# Patient Record
Sex: Male | Born: 1940 | Race: White | Hispanic: No | Marital: Married | Smoking: Former smoker
Health system: Southern US, Community
[De-identification: ages and names within clinical notes are randomized; demographics above are authoritative.]

## PROBLEM LIST (undated history)

## (undated) DIAGNOSIS — M1990 Unspecified osteoarthritis, unspecified site: Secondary | ICD-10-CM

## (undated) DIAGNOSIS — I255 Ischemic cardiomyopathy: Secondary | ICD-10-CM

## (undated) DIAGNOSIS — I2510 Atherosclerotic heart disease of native coronary artery without angina pectoris: Secondary | ICD-10-CM

## (undated) DIAGNOSIS — I10 Essential (primary) hypertension: Secondary | ICD-10-CM

## (undated) DIAGNOSIS — E785 Hyperlipidemia, unspecified: Secondary | ICD-10-CM

## (undated) DIAGNOSIS — E119 Type 2 diabetes mellitus without complications: Secondary | ICD-10-CM

## (undated) DIAGNOSIS — K219 Gastro-esophageal reflux disease without esophagitis: Secondary | ICD-10-CM

---

## 2001-03-18 HISTORY — PX: CORONARY ANGIOPLASTY: SHX604

## 2001-09-03 ENCOUNTER — Ambulatory Visit (HOSPITAL_COMMUNITY): Admission: RE | Admit: 2001-09-03 | Discharge: 2001-09-04 | Payer: Self-pay | Admitting: Cardiology

## 2006-03-18 HISTORY — PX: CARDIAC CATHETERIZATION: SHX172

## 2006-07-15 ENCOUNTER — Ambulatory Visit: Payer: Self-pay | Admitting: Nurse Practitioner

## 2006-08-04 ENCOUNTER — Ambulatory Visit: Payer: Self-pay | Admitting: Physician Assistant

## 2006-08-12 ENCOUNTER — Inpatient Hospital Stay (HOSPITAL_BASED_OUTPATIENT_CLINIC_OR_DEPARTMENT_OTHER): Admission: RE | Admit: 2006-08-12 | Discharge: 2006-08-12 | Payer: Self-pay | Admitting: Cardiology

## 2006-08-12 ENCOUNTER — Ambulatory Visit: Payer: Self-pay | Admitting: Cardiology

## 2006-08-18 ENCOUNTER — Ambulatory Visit: Payer: Self-pay | Admitting: Cardiology

## 2006-08-22 ENCOUNTER — Ambulatory Visit: Payer: Self-pay | Admitting: Physician Assistant

## 2008-04-12 ENCOUNTER — Encounter: Admission: RE | Admit: 2008-04-12 | Discharge: 2008-04-12 | Payer: Self-pay | Admitting: Orthopaedic Surgery

## 2008-04-18 HISTORY — PX: CERVICAL SPINE SURGERY: SHX589

## 2008-05-11 ENCOUNTER — Inpatient Hospital Stay (HOSPITAL_COMMUNITY): Admission: RE | Admit: 2008-05-11 | Discharge: 2008-05-12 | Payer: Self-pay | Admitting: Orthopedic Surgery

## 2008-05-11 ENCOUNTER — Encounter (INDEPENDENT_AMBULATORY_CARE_PROVIDER_SITE_OTHER): Payer: Self-pay | Admitting: Orthopedic Surgery

## 2009-09-15 HISTORY — PX: CATARACT EXTRACTION: SUR2

## 2009-10-12 ENCOUNTER — Ambulatory Visit (HOSPITAL_COMMUNITY): Admission: RE | Admit: 2009-10-12 | Discharge: 2009-10-12 | Payer: Self-pay | Admitting: Ophthalmology

## 2010-02-15 ENCOUNTER — Encounter: Payer: Self-pay | Admitting: Cardiology

## 2010-02-19 ENCOUNTER — Encounter: Payer: Self-pay | Admitting: Cardiology

## 2010-02-21 ENCOUNTER — Encounter: Payer: Self-pay | Admitting: Cardiology

## 2010-02-21 DIAGNOSIS — E1151 Type 2 diabetes mellitus with diabetic peripheral angiopathy without gangrene: Secondary | ICD-10-CM | POA: Insufficient documentation

## 2010-02-21 DIAGNOSIS — E785 Hyperlipidemia, unspecified: Secondary | ICD-10-CM | POA: Insufficient documentation

## 2010-02-21 DIAGNOSIS — K219 Gastro-esophageal reflux disease without esophagitis: Secondary | ICD-10-CM | POA: Insufficient documentation

## 2010-02-21 DIAGNOSIS — I2510 Atherosclerotic heart disease of native coronary artery without angina pectoris: Secondary | ICD-10-CM | POA: Insufficient documentation

## 2010-02-21 DIAGNOSIS — I10 Essential (primary) hypertension: Secondary | ICD-10-CM | POA: Insufficient documentation

## 2010-02-23 ENCOUNTER — Encounter (INDEPENDENT_AMBULATORY_CARE_PROVIDER_SITE_OTHER): Payer: Self-pay | Admitting: *Deleted

## 2010-02-23 ENCOUNTER — Ambulatory Visit: Payer: Self-pay | Admitting: Cardiology

## 2010-04-17 NOTE — Letter (Signed)
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 [address]   [place], [place] [address]   Phone: [phone]  Fax: [phone]    02/23/2010  TO: Leodis Sias IT MAY CONCERN   RE: VIRGLE ARTH Harju [address] [id number]   The above named individual is under my medical care and may return to work on: 02/23/10 without restrictions.  If you have any further questions or need additional information, please call.     Sincerely,    Gene Serpe, PA-C/Jennifer Christell Constant, RN, BSN

## 2010-04-17 NOTE — Miscellaneous (Signed)
  Clinical Lists Changes  Problems: Added new problem of CAD (ICD-414.00) Added new problem of DYSLIPIDEMIA (ICD-272.4) Added new problem of DM (ICD-250.00) Added new problem of GERD (ICD-530.81) Added new problem of HYPERTENSION (ICD-401.9) Observations: Added new observation of PAST MED HX: CAD    stent to right coronary artery..2003  /    catheterization.. ZOX,0960... total RCA at stent site with left to right collaterals, 70% proximal circumflex, 50% mid LAD... intensive medical therapy recommended  /  nuclear February 15, 2010.. walked 3 minutes Bruce protocol, no EKG change, no chest pain, hypertensive response to stress, diaphragmatic attenuation, EF 49%, study is compatible with inferior ischemia EF 55%.... catheterization... 2008 Dyslipidemia Diabetes mellitus GERD Hypertension Osteoarthritis Erectile dysfunction (02/21/2010 17:18)       Past History:  Past Medical History: CAD    stent to right coronary artery..2003  /    catheterization.. AVW,0981... total RCA at stent site with left to right collaterals, 70% proximal circumflex, 50% mid LAD... intensive medical therapy recommended  /  nuclear February 15, 2010.. walked 3 minutes Bruce protocol, no EKG change, no chest pain, hypertensive response to stress, diaphragmatic attenuation, EF 49%, study is compatible with inferior ischemia EF 55%.... catheterization... 2008 Dyslipidemia Diabetes mellitus GERD Hypertension Osteoarthritis Erectile dysfunction

## 2010-04-17 NOTE — Letter (Signed)
Summary: Letter/ [facility]  Letter/ [facility]   Imported By: Dorise Hiss 02/22/2010 16:42:23  _____________________________________________________________________  External Attachment:    Type:   Image     Comment:   External Document

## 2010-04-19 NOTE — Assessment & Plan Note (Signed)
Summary: np-abnormal stress test   Visit Type:  Initial Consult Primary Amil Bouwman:  Guido Sander   History of Present Illness: patient presents for evaluation of a recent abnormal stress Cardiolite test, in the context of known CAD. He has not been seen in our clinic since June 2008.  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 He was seen in the occupational health clinic, and then referred to Dr. Reuel Boom, who ordered an exercise stress Cardiolite. He had not had an ischemic evaluation, since undergoing cardiac catheterization, by Dr. Charlies Constable, in 2008. This revealed 100% occlusion of the distal RCA at the stent site, with distal collateralization, and otherwise noncritical disease; preserved LVF.  Clinically, patient has not had any recurrent anterior chest discomfort, or significant DOE, since undergoing initial stenting of the RCA, 2003. Prior to that intervention, he did experience exertional chest tightness.  The recent stress test was completed on December 1, and reviewed by Dr. Diona Browner. Patient exercised a little over 3 minutes, attaining 88% PMHR. There was no reported chest pain, or significant EKG changes. Perfusion imaging was consistent with inferior ischemia; EF 49%.  Preventive Screening-Counseling & Management  Alcohol-Tobacco     Smoking Status: quit     Year Quit: 1985  Current Medications (verified): 1)  Metformin Hcl 1000 Mg Tabs (Metformin Hcl) .... Take 1 Tablet By Mouth Two Times A Day 2)  Fish Oil 1000 Mg Caps (Omega-3 Fatty Acids) .... Take 1 Tablet By Mouth Once A Day 3)  Aspir-Low 81 Mg Tbec (Aspirin) .... Take 1 Tablet By Mouth Once A Day 4)  Plavix 75 Mg Tabs (Clopidogrel Bisulfate) .... Take 1 Tablet By Mouth Once A Day 5)  Lantus 100 Unit/ml Soln (Insulin Glargine) .... Use As Directed 6)  Glucosamine 500 Mg Caps (Glucosamine Sulfate) .... Take 1 Tablet By Mouth  Once A Day 7)  Hydrochlorothiazide 25 Mg Tabs (Hydrochlorothiazide) .... Take 1/2 Tablet By Mouth Once A Day 8)  Crestor 10 Mg Tabs (Rosuvastatin Calcium) .... Take 1 Tablet By Mouth Once A Day 9)  Lisinopril 40 Mg Tabs (Lisinopril) .... Take 1 Tablet By Mouth Once A Day  Allergies (verified): No Known Drug Allergies  Comments:  Nurse/Medical Assistant: The patient's medication bottles and allergies were reviewed with the patient and were updated in the Medication and Allergy Lists.  Past History:  Past Medical History: Last updated: 02/21/2010 CAD    stent to right coronary artery..2003  /    catheterization.. LKG,4010... total RCA at stent site with left to right collaterals, 70% proximal circumflex, 50% mid LAD... intensive medical therapy recommended  /  nuclear February 15, 2010.. walked 3 minutes Bruce protocol, no EKG change, no chest pain, hypertensive response to stress, diaphragmatic attenuation, EF 49%, study is compatible with inferior ischemia EF 55%.... catheterization... 2008 Dyslipidemia Diabetes mellitus GERD Hypertension Osteoarthritis Erectile dysfunction  Social History: Smoking Status:  quit  Review of Systems       No fevers, chills, hemoptysis, dysphagia, melena, hematocheezia, hematuria, rash, claudication, orthopnea, pnd, pedal edema. Complaint right knee pain, but no claudication. All other systems negative.   Vital Signs:  Patient profile:   70 year old male Height:      71 inches Weight:      255 pounds BMI:     35.69 Pulse rate:   80 / minute BP sitting:   136 / 80  (left arm) Cuff size:  large  Vitals Entered By: Carlye Grippe (February 23, 2010 1:53 PM)  Nutrition Counseling: Patient's BMI is greater than 25 and therefore counseled on weight management options.   Physical Exam  Additional Exam:  GEN: 14 a-year-old male, obese, in no distress HEENT: NCAT,PERRLA,EOMI NECK: palpable pulses, no bruits; no JVD; no TM LUNGS: CTA  bilaterally HEART: RRR (S1S2); no significant murmurs; no rubs; no gallops ABD: soft, NT; intact BS EXT: intact distal pulses; probable bilateral femoral pulses normal without bruits; no edema SKIN: warm, dry MUSC: no obvious deformity NEURO: A/O (x3)     Impression & Recommendations:  Problem # 1:  CAD (ICD-414.00)  no further cardiac workup or testing is recommended. Results of the recent stress test were reviewed in full, in conjunction with Dr. Myrtis Ser. We feel that this perfusion defect is consistent with previously documented coronary anatomy, by catheterization in 2008, which indicated 100% occlusion of the distal RCA stent site, with collateral circulation. Moreover, the patient presents with no interim development of exertional chest discomfort or dyspnea. 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  Problem # 2:  DYSLIPIDEMIA (ICD-272.4)  aggressive lipid management recommended with target LDL 70 or less, if feasible. We'll defer to Dr. Reuel Boom for ongoing monitoring and management  Problem # 3:  HYPERTENSION (ICD-401.9) Assessment: Comment Only  Problem # 4:  DM (ICD-250.00) Assessment: Comment Only  Patient Instructions: 1)  Your physician wants you to follow-up in: 6 months. You will receive a reminder letter in the mail one-two months in advance. If you don't receive a letter, please call our office to schedule the follow-up appointment. 2)  Your physician recommends that you continue on your current medications as directed. Please refer to the Current Medication list given to you today.

## 2010-06-02 LAB — GLUCOSE, CAPILLARY: Glucose-Capillary: 184 mg/dL — ABNORMAL HIGH (ref 70–99)

## 2010-06-02 LAB — BASIC METABOLIC PANEL
BUN: 16 mg/dL (ref 6–23)
CO2: 22 mEq/L (ref 19–32)
Glucose, Bld: 200 mg/dL — ABNORMAL HIGH (ref 70–99)
Potassium: 4.3 mEq/L (ref 3.5–5.1)
Sodium: 135 mEq/L (ref 135–145)

## 2010-07-03 LAB — GLUCOSE, CAPILLARY
Glucose-Capillary: 156 mg/dL — ABNORMAL HIGH (ref 70–99)
Glucose-Capillary: 157 mg/dL — ABNORMAL HIGH (ref 70–99)

## 2010-07-03 LAB — COMPREHENSIVE METABOLIC PANEL
Alkaline Phosphatase: 64 U/L (ref 39–117)
BUN: 11 mg/dL (ref 6–23)
CO2: 28 mEq/L (ref 19–32)
Chloride: 100 mEq/L (ref 96–112)
GFR calc non Af Amer: >60 mL/min (ref >60)
Glucose, Bld: 171 mg/dL — ABNORMAL HIGH (ref 70–99)
Potassium: 5 mEq/L (ref 3.5–5.1)
Total Bilirubin: 0.3 mg/dL (ref 0.3–1.2)

## 2010-07-03 LAB — URINALYSIS, ROUTINE W REFLEX MICROSCOPIC
Bilirubin Urine: NEGATIVE
Hgb urine dipstick: NEGATIVE
Specific Gravity, Urine: 1.012 (ref 1.005–1.030)
Urobilinogen, UA: 1 mg/dL (ref 0.0–1.0)

## 2010-07-03 LAB — DIFFERENTIAL
Basophils Absolute: 0 10*3/uL (ref 0.0–0.1)
Basophils Relative: 1 % (ref 0–1)
Monocytes Absolute: 0.6 10*3/uL (ref 0.1–1.0)
Neutro Abs: 2.4 10*3/uL (ref 1.7–7.7)
Neutrophils Relative %: 48 % (ref 43–77)

## 2010-07-03 LAB — CBC
HCT: 41.2 % (ref 39.0–52.0)
Hemoglobin: 14.6 g/dL (ref 13.0–17.0)
MCHC: 35.4 g/dL (ref 30.0–36.0)
MCV: 87.6 fL (ref 78.0–100.0)
Platelets: 118 10*3/uL — ABNORMAL LOW (ref 150–400)
RDW: 12.9 % (ref 11.5–15.5)
WBC: 5.1 10*3/uL (ref 4.0–10.5)

## 2010-07-03 LAB — HEMOGLOBIN A1C: Hgb A1c MFr Bld: 7.3 % — ABNORMAL HIGH (ref 4.6–6.1)

## 2010-07-03 LAB — BASIC METABOLIC PANEL
BUN: 6 mg/dL (ref 6–23)
CO2: 28 mEq/L (ref 19–32)
Chloride: 103 mEq/L (ref 96–112)
Creatinine, Ser: 0.73 mg/dL (ref 0.4–1.5)
Glucose, Bld: 130 mg/dL — ABNORMAL HIGH (ref 70–99)

## 2010-07-03 LAB — TYPE AND SCREEN: Antibody Screen: NEGATIVE

## 2010-07-03 LAB — APTT: aPTT: 33 seconds (ref 24–37)

## 2010-07-03 LAB — PROTIME-INR: INR: 1 (ref 0.00–1.49)

## 2010-07-31 NOTE — Assessment & Plan Note (Signed)
[facility]                          [facility] NOTE   NAME:Trevor [name], Trevor [name]                     MRN:          [id number]  DATE:08/18/2006                            DOB:          February 12, 1941    CARDIOLOGIST:  Jonelle Sidle, M.D.   PRIMARY CARE PHYSICIAN:  Dr. Donzetta Sprung.   HISTORY OF PRESENT ILLNESS:  Mr. Flamenco is a 70 year old male patient  with a history of coronary disease who was seen back on Aug 04, 2006, in  followup.  He was set up for cardiac catheterization secondary to chest  discomfort concerning for a stable exertional angina.  This was  performed on Aug 12, 2006 by Dr. Juanda Chance.  This revealed a total  occlusion of the distal RCA at the stent site with left and right to  right collaterals.  He also had a 70% narrowing of the proximal  circumflex and 50% narrowing of the mid LAD.  His EF was 55%.  Dr.  Juanda Chance recommended intensive medical therapy.  The patient returns today  for followup.  He only notes one episode of chest tightness since we  last saw him when he was chasing his grandson.  He has not taken any  nitroglycerin.  He denies any rest symptoms.  He denies any syncope or  near syncope.  He denies any significant shortness of breath.  He does  note a history of intolerance to Statins in the past.  He has tried  Lipitor and Zocor.  These both caused myalgias.  Dr. Juanda Chance recommended  intensive secondary prevention secondary to increased plaque burden at  the time of the patient's cardiac catheterization.   CURRENT MEDICATIONS:  1. Metformin 500 mg b.i.d.  2. Niaspan 500 mg daily.  3. Fish oil daily.  4. Aspirin 325 mg daily.  5. Garlic.  6. Plavix 75 mg daily.  7. Lantus 60 units b.i.d.  8. Nexium 40 mg b.i.d.  9. Imdur 30 mg daily.  He has been out of this for two days.  10.Metoprolol 50 mg daily.  He has been out of this for two days.   PHYSICAL EXAMINATION:  GENERAL:  He is well nourished, well  developed,  no distress.  VITAL SIGNS:  Blood pressure is 184/92, pulse 98, repeat blood pressure  is 174/96 on the right and 172/94 on the left, weight 266.2 pounds.  HEENT:  Normal.  NECK:  Without JVD.  CARDIAC:  Normal S1 S2.  Regular rate and rhythm without murmurs.  LUNGS:  Clear to auscultation bilaterally without wheezing, rhonchi, or  rales.  ABDOMEN:  Soft nontender.  EXTREMITIES:  Without edema.  Calves - soft, nontender.  SKIN:  Warm and dry.  Right femoral arteriotomy site without hematoma or  bruit.  Positive ecchymosis noted.   IMPRESSION:  1. Coronary artery disease.      a.     Status post stenting to the distal right coronary artery,       June 2003.      b.     Recent cardiac catheterization as noted above, showing total  occlusion of the right coronary artery with left and right to       right collaterals, 70% proximal circumflex, and 50% mid LAD       stenosis - medical therapy.      c.     Stable exertional angina.  2. Preserved left ventricular function, ejection fraction 55% by      recent cardiac catheterization.  3. Dyslipidemia.  4. Diabetes mellitus.  5. Gastroesophageal reflux disease.  6. Hypertension, uncontrolled.  7. Osteoarthritis.  8. Erectile dysfunction.   PLAN:  The patient presents to the office today for post catheterization  followup.  He is doing well from a symptomatic standpoint.  His blood  pressure is out of control but he has missed both of his metoprolol and  Imdur for the last two days.  I have elected to increase his metoprolol  to 100 mg a day and refilled his prescription for him.  We have also  refilled his prescription for Imdur.  He does not feel like he needs any  treatment for his erectile dysfunction.  I did offer switching him from  Imdur to Norvasc to treat his angina if he felt that his erectile  dysfunction was becoming a significant problem.  At this point in time,  he prefers to stay on the Imdur and agrees  to stay off of Cialis.  He  does need intensive treatment of his cholesterol.  I think he needs to  be on a Statin, given his CAD.  He has only tried Lipitor and Zocor in  the past.  I think it is worthwhile to try him on Crestor to see if he  can tolerate this.  I have given him samples and a prescription.  If he  can tolerate it, then we will recheck lipids and LFTs in about 6-8  weeks.  He will return later this week for a blood pressure check with  the nurse.  I will bring him back in followup with Dr. Andee Lineman in the  next 6-8 weeks.      Tereso Newcomer, PA-C  Electronically Signed      Luis Abed, MD, [facility]  Electronically Signed   SW/MedQ  DD: 08/18/2006  DT: 08/18/2006  Job #: [id number]   cc:   Donzetta Sprung

## 2010-07-31 NOTE — Assessment & Plan Note (Signed)
[facility]                          [facility] NOTE   NAME:Trevor [name], Trevor [name]                     MRN:          [id number]  DATE:08/04/2006                            DOB:          [birth year]    HISTORY OF PRESENT ILLNESS:  [name] is a 70 year old male patient  with a history of coronary artery disease status post stenting to distal  RCA stenosis in June of 2003 by Dr. Gerri Spore who returns today for  follow up.  At the time of his catheterization in 2003, he had a 40%  proximal and 50% distal LAD stenosis, large ramus with 50% proximal  stenosis and circumflex with 50% stenosis just prior to the takeoff, and  40% proximal RCA stenosis.  The patient had recently complained of  recurrent chest discomfort and shortness of breath with exertion similar  to his previous angina.  He saw Trevor Pod, NP on July 15, 2006.  She set him up for cardiac catheterization.  This was postponed until  Aug 12, 2006 due to scheduling conflicts with the patient.  His medical  therapy was titrated.  He returns today for follow up.  He notes he has  had no further chest pain or shortness of breath since he saw Mrs.  [name].  He does note that he has not done too much in the way of  exertion.  He denies any syncope or near syncope.  He denies any  orthopnea or paroxysmal nocturnal dyspnea.  He does note some increased  heart rate with exertion.  Denies any syncope.   CURRENT MEDICATIONS:  1. Metformin 500 mg twice a day.  2. Niaspan 500 mg a day.  3. Fish oil.  4. Aspirin 325 mg daily.  5. Nexium 40 mg daily.  6. Garlic.  7. Plavix 75 mg daily.  8. Lantus 60 units b.i.d.  9. Imdur 30 mg half tablet daily.  10.Metoprolol succinate 25 mg daily.  11.Cialis p.r.n.  12.Zantac p.r.n.   ALLERGIES:  No known drug allergies.  He is intolerant to multiple  STATINS secondary to myalgias.   SOCIAL HISTORY:  Denies any tobacco abuse.  He is an  ex-smoker.   FAMILY HISTORY:  Significant for coronary disease in his mother.   REVIEW OF SYSTEMS:  Please see HPI.  Denies any fevers, chills, cough,  melena, hematochezia, hematuria or dysuria.  The rest of the review of  systems are negative.   PHYSICAL EXAMINATION:  GENERAL:  He is well-nourished and well-  developed.  VITAL SIGNS:  Blood pressure 165/93, pulse 72, weight 269 pounds.  HEENT:  Normal.  NECK:  Without jugular venous distention.  Carotids without bruits  bilaterally.  Neck without lymphadenopathy.  ENDOCRINE:  Without thyromegaly.  CARDIAC:  S1 and S2.  Regular rate and rhythm without murmurs.  LUNGS:  Clear to auscultation bilaterally without wheezes, rhonchi or  rales.  ABDOMEN:  Soft, nontender with positive bowel sounds.  No organomegaly.  EXTREMITIES:  1+ edema bilaterally.  Calves are soft and nontender.  SKIN:  Warm and dry.  NEUROLOGIC:  Alert and oriented x3.  Cranial nerves II-XII grossly  intact.   IMPRESSION:  1. Stable exertional angina - improved on medical therapy.  2. Coronary artery disease.      a.     Status post stenting to the distal right coronary artery in       June of 2003.      b.     Residual coronary artery disease as noted above.  3. Preserved left ventricular function.  4. Dyslipidemia.      a.     Intolerant to multiple statins.  5. Diabetes mellitus, insulin dependent.  6. Gastroesophageal reflux disease.  7. Osteoarthritis.  8. Hypertension, uncontrolled.  9. Erectile dysfunction.   PLAN:  The patient is doing well from a cardiovascular standpoint.  His  symptoms have improved; however, there was a change in his symptoms that  prompted his return visit.  We still think it is imperative that he  undergo cardiac catheterization.  I discussed this with Dr. Myrtis Ser who  agrees.  Per the COURAGE trial data, we may be able to proceed with just  intensive medical therapy, depending on the results of his cardiac  catheterization.  I  have gone over this with the patient today.  His  blood pressure can certainly tolerate an increase in his Toprol to 50 mg  a day and Imdur to 30 mg a day.  Will get everything squared away for  his catheterization on Aug 12, 2006.  I have advised the patient today that he is NOT to take Cialis at  all while he is on Imdur, and I have gone over with him the possible  adverse reactions.  He understands.      Trevor Newcomer, PA-C  Electronically Signed      Luis Abed, MD, [facility]  Electronically Signed   SW/MedQ  DD: 08/04/2006  DT: 08/04/2006  Job #: [id number]   cc:   Cheyenne Adas, MD

## 2010-07-31 NOTE — Cardiovascular Report (Signed)
Trevor [name], Trevor [name]              ACCOUNT NO.:  [id number]   MEDICAL RECORD NO.:  [id number]          PATIENT TYPE:  OIB   LOCATION:  1962                         FACILITY:  [facility]   PHYSICIAN:  Trevor R. Juanda Chance, MD, FACCDATE OF BIRTH:  09-15-1940   DATE OF PROCEDURE:  08/12/2006  DATE OF DISCHARGE:                            CARDIAC CATHETERIZATION   His abdomen is very getting a cardiac catheterization for an Lantus pain  [name].  The patient's 08/12/2006 send copies to Dr. Dr. Jerral Bonito and Dr. Donzetta Sprung myself and carpal-metacarpal   HISTORY:  Trevor [name] is 70 years old and had prior stenting of the distal  right coronary artery in 2003 by Dr. Gerri Spore.  He recently developed  symptoms of chest pain and was scheduled for catheterization but this  was delayed for logistical reasons.  Symptoms improved with a decision  made to go ahead and proceed with catheterization which was scheduled  for today.  He also has insulin-dependent diabetes.  He also has  hyperlipidemia and hypertension.   PROCEDURE:  The procedure was performed via the right femoral artery  using arterial sheath and 4-French preformed coronary catheters.  A  front wall arterial puncture was performed and Omnipaque contrast was  used.  Due to a short left main, we selectively entered the LAD with a  FL-4 catheter and selectively entered the circumflex artery with an FL-5  catheter.  We use nitroglycerin to take extra pictures of the circumflex  artery.  The patient tolerated the procedure well and left the  laboratory in satisfactory condition.   RESULTS:  The left main coronary artery:  Left main coronary is a short  vessel and was free of disease.   Left anterior descending artery:  The left anterior descending artery  gave rise to a large diagonal branch, two septal perforators, a moderate  diagonal branch, and a small diagonal branch.  There was the stent  narrowing in the proximal mid-LAD.   There was diffuse irregularity  throughout the LAD.  There was 30% narrowing in the proximal first large  diagonal branch.   The circumflex artery:  The circumflex artery gave rise to an atrial  branch, marginal branch and AV branch.  There was 70% narrowing in the  proximal circumflex artery before the marginal branch.   The right coronary artery:  The right coronary artery was a moderate-  sized vessel, gave rise to two right ventricular branches.  There was a  95% stenosis in the distal vessel; then there was complete occlusion at  the stent site.  Posterior descending branch filled via collaterals from  left coronary artery.  Posterolateral branch filled via right to right  collaterals.   LEFT VENTRICULOGRAPHY:  Performed in the RAO projection showed  hypokinesis in the inferobasal segment.  The overall wall motion was  good with an estimated ejection fraction of 55%.   HEMODYNAMIC DATA:  Aortic pressure is 165/84 with mean of 116.  The left  neck pressure is 165/14.   CONCLUSION:  Coronary artery, status post prior stenting of the distal  right coronary  artery with total occlusion of the distal right coronary  at the stent site, 70% narrowing of the proximal circumflex artery, 50%  narrowing in the mid LAD and inferobasal wall hypokinesis with estimated  fraction of 55%.   RECOMMENDATIONS:  The patient has developed progressive disease with  occlusion of the stent site in the right coronary artery.  He has  nonobstructive disease in the left coronary system.  The right coronary  is not favorable for percutaneous coronary intervention.  Will plan  continued medical therapy.  He does have a lot of plaque in the vessels  so intensive secondary prevention is important.  The procedure was done  in the outpatient laboratory and will arrange followup with Dr. Myrtis Ser in  a couple weeks.      Trevor Elvera Lennox Juanda Chance, MD, [facility]  Electronically Signed     BRB/MEDQ  D:  08/12/2006  T:   08/12/2006  Job:  [id number]   cc:   Luis Abed, MD, [facility]  Donzetta Sprung

## 2010-07-31 NOTE — Op Note (Signed)
Trevor [name], WACHA NO.:  [id number]   MEDICAL RECORD NO.:  [id number]          PATIENT TYPE:  INP   LOCATION:  2550                         FACILITY:  [facility]   PHYSICIAN:  Nelda Severe, MD      DATE OF BIRTH:  22-Oct-1940   DATE OF PROCEDURE:  05/11/2008  DATE OF DISCHARGE:                               OPERATIVE REPORT   SURGEON:  Nelda Severe, MD   ASSISTANT:  Lianne Cure, PA-C   PREOPERATIVE DIAGNOSIS:  C5-C6 spondylosis/stenosis.   POSTOPERATIVE DIAGNOSIS:  C5-C6 spondylosis/stenosis.   PROCEDURES:  Anterior cervical decompression, C5-C6, bilateral  foraminotomies, anterior interbody fusion with PEEK cage and local  autograft/INQU, titanium plate and screws.   PROCEDURE IN DETAIL:  The patient was placed under general endotracheal  anesthesia.  Intravenous antibiotics had been infused.  Sequential  compression devices were placed on both lower extremities.  He was  positioned supine on a Jackson flat table.  A folded towel was placed  transversely under the shoulder blades.  The head supported on a donut.  The arms were tucked at the sides.  A wide adhesive tape was attached to  the superior aspect of both shoulders after painting the skin with  tincture of benzoin.  The tape was secured to the distal end of the  table just to depress the shoulders.  A needle was taped to the skin and  a cross-table lateral radiograph taken showing the needle was  approximately at the C3, C4 level.  The skin of the anterior cervical  spine was prepped with DuraPrep and draped in square fashion.  The  drapes were secured with Ioban.  A time-out was held at which time the  identity of patient, diagnosis, and proposed surgery were confirmed as  well as the other parameters covered in a time-out.   A transverse incision was made.  We approximated to be the level of the  C5 body, midline to the anterior border of the left sternocleidomastoid.  The platysma was  incised in line with the incision.  The deep cervical  fascia was bluntly dissected down onto the front of his spinal column.  The midline structures were retracted to the right with an appendiceal  retractor.  A peanut was used to bluntly dissect the soft tissue away  from the longus colli muscles.  The disk space which we perceived to  probably be C5-C6 was marked with a spinal needle and a cross-table  lateral radiograph confirmed our level.  This was further confirmed by  the radiologist.   The longus colli muscles were mobilized above and below the C5-C6 disk.  A Shadow-Line retractor was placed to retract the longus colli muscles  bilaterally.  Caspar pins were placed above and below and distraction  applied to the disk space.  Anterior ossification was removed in front  of the disk, using a Leksell rongeur.  The disk was then enucleated  using rongeurs and angled curettes.  I then used a high-speed bur to  open up the disk space.  We had exposed in the disk space, from  uncovertebral  joint to uncovertebral joint.  The thickened annulus was  removed posteriorly and foraminotomies were performed bilaterally until  a probe could be admitted and there was no remaining compression on  palpation.  The endplates were further denuded of cartilage with the  bur.  We sized the space to 8 mm.   A 2.5 mL INQU were mixed with 5 mL of blood and the autologous bone  harvested from the burring.  Some of the mixture was placed laterally  between the endplates and the rest used to fill the cage.  The cage was  then placed between the C5-C6 endplates and the distractor released in  order to grip the cage.  We then placed a one-level 4-hole titanium  plate with screws measuring 14 mm in length.  A cross-table lateral  radiograph was then taken and showed satisfactory position of the C6  screws, C7 could not be visualized.  The screws were all engaged in the  plate.  We then placed a 7-gauge Silastic  drain deeply in the wound and  brought it out through a stab wound to the right side of the medial end  of the wound.  It was a suction drain.  We then closed the platysma  using interrupted 0-Vicryl suture in the inverted fashion.  The skin was  closed using subcuticular 3-0 undyed running Vicryl.  The skin edges  were reinforced with Steri-Strips.  A gauze dressing was applied and  held with a towel.   There were no intraoperative complications.  The sponge and needle  counts were correct.  At the time of dictation, the patient has not yet  been examined in the recovery room.  Additionally, blood loss was  estimated at less than 100 mL.      Nelda Severe, MD  Electronically Signed     MT/MEDQ  D:  05/11/2008  T:  05/12/2008  Job:  [id number]

## 2010-08-03 NOTE — Cardiovascular Report (Signed)
[place]  Patient:    Trevor [name], Trevor [name] Visit Number: [id number] MRN: [id number]          Service Type: CAT Location: 6500 [id number] Attending Physician:  Jonelle Sidle Dictated by:   Jonelle Sidle, M.D. [facility] Proc. Date: 09/03/01 Admit Date:  09/03/2001 Discharge Date: 09/04/2001   CC:         Donzetta Sprung, M.D.   Cardiac Catheterization  DATE OF BIRTH: 12-29-1940  REFERRING PHYSICIAN: Donzetta Sprung, M.D.  Corinda Gubler CARDIOLOGIST: Jonelle Sidle, M.D.  INDICATIONS: The patient is a 70 year old male with a history of hypertension, type 2 diabetes mellitus, and dyslipidemia who presents with exertional chest discomfort. He is referred for cardiac catheterization to define the coronary anatomy and left ventricular function.  PROCEDURES PERFORMED: 1. Left heart catheterization. 2. Selective coronary angiography. 3. Left ventriculography.  ACCESS AND EQUIPMENT: The area about the right femoral artery was anesthetized with 1% lidocaine. A 6 French sheath was placed in the right femoral artery via the modified Seldinger technique. Standard preformed 6 [place] and JR4 catheters were used for selective coronary angiography. A 6 French angled pigtail catheter was used for left heart catheterization and left ventriculography. All exchanges were made over a wire.  The patient tolerated the procedure well without complications.  HEMODYNAMICS: Left ventricle 144/17, aorta 144/72.  ANGIOGRAPHIC FINDINGS: 1. The left main coronary artery is short and free of significant    flow-limiting coronary atherosclerosis. 2. The left anterior descending artery provides two diagonal branches and    is calcified proximally. There is 40% stenosis proximally and 50%    stenosis more distally. Other minor luminal irregularities are noted    throughout the vessel. 3. There is a large ramus intermedius that has a 50% proximal stenosis. 4. The  circumflex provides a large bifurcating obtuse marginal. There is    50% stenosis just prior to this takeoff. 5. The right coronary artery is dominant providing the posterior descending    branch. There is 30-40% stenosis proximally and a 75% distal stenosis with    a hazy character. Flow is TIMI-3.  LEFT VENTRICULOGRAPHY: Left ventriculography was performed in the RAO projection and revealed an ejection fraction of 60-65% without focal wall motion abnormalities. Trace mitral regurgitation was noted in the setting of ventricular ectopy.  DIAGNOSES: 1. Moderate coronary atherosclerosis with a 75% hazy distal right coronary    artery lesion, which is likely the culprit. 2. Left ventricular ejection fraction of 60-65%.  RECOMMENDATIONS: Will discuss films with Dr. Gerri Spore and plan on percutaneous intervention to the distal right coronary artery lesion. We will otherwise aggressively manage in terms of risk factor modification and medical therapy for the remaining atherosclerotic burden. Dictated by:   Jonelle Sidle, M.D. [facility] Attending Physician:  Jonelle Sidle DD:  09/03/01 TD:  09/05/01 Job: [id number]

## 2010-08-03 NOTE — Procedures (Signed)
[place]  Patient:    Trevor [name], Trevor [name] Visit Number: [id number] MRN: [id number]          Service Type: CAT Location: 6500 [id number] [id number] Attending Physician:  Jonelle Sidle Dictated by:   Daisey Must, M.D. [facility] Proc. Date: 09/03/01 Admit Date:  09/03/2001 Discharge Date: 09/04/2001   CC:         Donzetta Sprung, M.D.  [facility], [place], [place].  Cardiac Catheterization Lab   Procedure Report  PROCEDURES PERFORMED:  Percutaneous transluminal coronary angioplasty with stent placement in the distal right coronary artery.  INDICATIONS:  The patient is a 70 year old male who has been having progressive exertional angina.  Cardiac catheterization performed earlier today by Dr. Diona Browner revealed the presence of a very hazy irregular 80% stenosis in the distal right coronary artery.  After review of the images, we opted to proceed with percutaneous coronary intervention.  PROCEDURAL NOTE:  A preexisting #6 French sheath in the right femoral artery was exchanged over a wire for a #7 [place] sheath.  Angiomax was administered per protocol.  We used a #7 [place] JR4 guiding catheter with sideholes and a BMW wire.  We initially dilated the lesion with a 3.0 x 15-mm Quantum balloon inflated to 10 atmospheres.  We then deployed a 3.0 x 23-mm Cypher drug-eluting stent in the distal vessel at a deployment pressure of 11 atmospheres.  The stent was then post dilated with a 3.0 x 15-mm Quantum balloon inflated to 14 atmospheres in the distal aspect of the stent and 20 atmospheres in the proximal aspect of the stent.  Final angiography images revealed patency of the distal right coronary artery with 0% residual stenosis and TIMI-3 flow.  COMPLICATIONS:  None.  RESULTS:  Successful percutaneous transluminal coronary angioplasty with stent placement in the distal right coronary artery utilizing a drug-eluting stent. An 80% stenosis with haziness was  reduced to 0% residual with TIMI-3 flow.  PLAN:  Angiomax will be discontinued at this point.  Plavix will be administered for 6 months in conjunction with lifelong aspirin therapy. Dictated by:   Daisey Must, M.D. [facility] Attending Physician:  Jonelle Sidle DD:  09/03/01 TD:  09/04/01 Job: [id number]

## 2010-08-03 NOTE — Discharge Summary (Signed)
[place]  Patient:    Trevor [name], Trevor [name] Visit Number: [id number] MRN: [id number]          Service Type: CAT Location: 6500 [id number] 01 Attending Physician:  Jonelle Sidle Dictated by:   Joellyn Rued, P.A.-C. Admit Date:  09/03/2001 Discharge Date: 09/04/2001   CC:         [facility] [address], [place]  Donzetta Sprung, M.D., [place], [place]   Referring Physician Discharge [place]  DATE OF BIRTH:  October 29, 1940  ADMITTING PHYSICIAN:  Jonelle Sidle, M.D.  DISCHARGING PHYSICIAN:  Daisey Must, M.D.  SUMMARY OF HISTORY:  Mr. Caraway is a 70 year old [place] male, who presented to our office in [place], on August 24, 2001, with a 6-8 month history of intermittent predominantly exertional left-sided chest pressure, radiating to the back and associated with an aching sensation in his left arm.  The symptoms would Lasix at the most 10 minutes and subside with rest.  He could go days without having symptoms, but then he may have 1-2 episodes per day.  He has no prior cardiac history.  He does have a history of hyperlipidemia with a cholesterol of 294, triglycerides 496, HDL 31.  Non-insulin dependent diabetes for five years, GERD, impotence, and hypertension and obesity.  LABORATORY DATA:  At [facility] on August 25, 2001, sodium 132, potassium 4.3, BUN 14, creatinine 1.0, glucose 253.  H&H was 15.5 and 44.6, normal indices, platelets 225, WBC 4.7.  Chest x-ray showed very slight hyperinflation configuration.  EKG showed normal sinus rhythm, nonspecific ST-T wave changes.  Postcatheterization, H&H was 14.2, and 40.3, normal indices, platelets 137, WBC 6.7.  Sodium 135, potassium 4.3, BUN 11, creatinine 1.0, glucose 227.  Postprocedure, CK-MB was 72 and 27.  HOSPITAL COURSE:  Mr. Mika was brought in for day catheterization.  This was performed by Dr. Diona Browner.  According to his progress note, this showed an EF of 60-65%.  He had  a proximal calcified LAD with a 40% proximal lesion.  He also had a 50% distal LAD.  The ramus had a 50% proximal lesion.  The circumflex a 50% mid lesion.  The RCA had a 30-40% proximal lesions, 75% distal lesion which was felt to be hazy.  He had trace MR without ectopy. After reviewing with Dr. Gerri Spore, angioplasty stenting was performed to the distal RCA, reducing this from 80 to 0%.  It was noted that he did use a Cypher drug-eluding stent.  Postprocedure, sheaths were removed and, after bed rest, he was ambulating without difficulty.  Catheterization site did show a moderate amount of ecchymosis with a soft hematoma.  He did not have any bruits, and pedal pulses were symmetrical and intact distally.  DISPOSITION:  After review, Dr. Gerri Spore felt that the patient could be discharged home with diagnosis of unstable angina, coronary artery disease, status post angioplasty stenting of the distal RCA, as previously described, hyperglycemia, history of non-insulin dependent diabetes, also a history of GERD, hyperlipidemia, hypertension, and impotence.  He was discharged home after he was seen by cardiac rehab.  NEW MEDICATIONS: 1. Plavix 75 mg q.d. x 6 months. 2. Coated aspirin 325 mg q.d. 3. Sublingual nitroglycerin p.r.n. 4. He was asked to resume his Glucophage 1000 mg b.i.d. on Sunday and to    continue his other medications which include: 5. Lopid 600 mg b.i.d. 6. Lisinopril 20 mg q.d. 7. Glucotrol 10 mg q.d. 8. Avandia 8  mg q.d. 9. Prilosec 20 mg q.d.  DISCHARGE INSTRUCTIONS: 1. He was advised no lifting, driving, sexual activity, or heavy exertion for    two days. 2. Maintain a low salt, low fat, low cholesterol, ADA diet. 3. If he has any problems with his catheterization site, he was instructed to    call us immediately.  FOLLOW-UP: 1. He will see Dr. Diona Browner in the [facility] office on September 21, 2001, at 2:30 p.m. 2. Concern should be given to aggressive cardiac risk  factor modification,    i.e., following lipid panel closely and sugar control, exercise and weight    loss program. Dictated by:   Joellyn Rued, P.A.-C. Attending Physician:  Jonelle Sidle DD:  09/04/01 TD:  09/04/01 Job: [id number]
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
## 2014-01-03 ENCOUNTER — Other Ambulatory Visit: Payer: Self-pay

## 2014-01-03 ENCOUNTER — Encounter (HOSPITAL_COMMUNITY): Payer: Self-pay | Admitting: Cardiology

## 2014-01-03 ENCOUNTER — Inpatient Hospital Stay (HOSPITAL_COMMUNITY)
Admission: AD | Admit: 2014-01-03 | Discharge: 2014-01-08 | DRG: 280 | Disposition: A | Discharge status: Home / Self Care | Payer: PRIVATE HEALTH INSURANCE | Source: Other Acute Inpatient Hospital | Attending: Cardiology | Admitting: Cardiology

## 2014-01-03 DIAGNOSIS — I469 Cardiac arrest, cause unspecified: Secondary | ICD-10-CM | POA: Diagnosis present

## 2014-01-03 DIAGNOSIS — E785 Hyperlipidemia, unspecified: Secondary | ICD-10-CM | POA: Diagnosis present

## 2014-01-03 DIAGNOSIS — I472 Ventricular tachycardia, unspecified: Secondary | ICD-10-CM

## 2014-01-03 DIAGNOSIS — S066XAA Traumatic subarachnoid hemorrhage with loss of consciousness status unknown, initial encounter: Secondary | ICD-10-CM | POA: Diagnosis present

## 2014-01-03 DIAGNOSIS — I214 Non-ST elevation (NSTEMI) myocardial infarction: Secondary | ICD-10-CM | POA: Diagnosis present

## 2014-01-03 DIAGNOSIS — I255 Ischemic cardiomyopathy: Secondary | ICD-10-CM

## 2014-01-03 DIAGNOSIS — I10 Essential (primary) hypertension: Secondary | ICD-10-CM | POA: Diagnosis present

## 2014-01-03 DIAGNOSIS — S065X9A Traumatic subdural hemorrhage with loss of consciousness of unspecified duration, initial encounter: Secondary | ICD-10-CM | POA: Diagnosis present

## 2014-01-03 DIAGNOSIS — Z87891 Personal history of nicotine dependence: Secondary | ICD-10-CM | POA: Diagnosis not present

## 2014-01-03 DIAGNOSIS — I2582 Chronic total occlusion of coronary artery: Secondary | ICD-10-CM | POA: Diagnosis present

## 2014-01-03 DIAGNOSIS — I4729 Other ventricular tachycardia: Secondary | ICD-10-CM

## 2014-01-03 DIAGNOSIS — S06369A Traumatic hemorrhage of cerebrum, unspecified, with loss of consciousness of unspecified duration, initial encounter: Secondary | ICD-10-CM

## 2014-01-03 DIAGNOSIS — W1830XA Fall on same level, unspecified, initial encounter: Secondary | ICD-10-CM | POA: Diagnosis present

## 2014-01-03 DIAGNOSIS — E1159 Type 2 diabetes mellitus with other circulatory complications: Secondary | ICD-10-CM | POA: Diagnosis present

## 2014-01-03 DIAGNOSIS — Z794 Long term (current) use of insulin: Secondary | ICD-10-CM

## 2014-01-03 DIAGNOSIS — I4901 Ventricular fibrillation: Principal | ICD-10-CM | POA: Diagnosis present

## 2014-01-03 DIAGNOSIS — Z833 Family history of diabetes mellitus: Secondary | ICD-10-CM

## 2014-01-03 DIAGNOSIS — K219 Gastro-esophageal reflux disease without esophagitis: Secondary | ICD-10-CM | POA: Diagnosis present

## 2014-01-03 DIAGNOSIS — I5022 Chronic systolic (congestive) heart failure: Secondary | ICD-10-CM | POA: Diagnosis present

## 2014-01-03 DIAGNOSIS — M1990 Unspecified osteoarthritis, unspecified site: Secondary | ICD-10-CM | POA: Diagnosis present

## 2014-01-03 DIAGNOSIS — I2510 Atherosclerotic heart disease of native coronary artery without angina pectoris: Secondary | ICD-10-CM | POA: Diagnosis present

## 2014-01-03 DIAGNOSIS — Z955 Presence of coronary angioplasty implant and graft: Secondary | ICD-10-CM

## 2014-01-03 DIAGNOSIS — S066X9A Traumatic subarachnoid hemorrhage with loss of consciousness of unspecified duration, initial encounter: Secondary | ICD-10-CM | POA: Diagnosis present

## 2014-01-03 DIAGNOSIS — S06340A Traumatic hemorrhage of right cerebrum without loss of consciousness, initial encounter: Secondary | ICD-10-CM

## 2014-01-03 DIAGNOSIS — Z7982 Long term (current) use of aspirin: Secondary | ICD-10-CM

## 2014-01-03 DIAGNOSIS — S066X1D Traumatic subarachnoid hemorrhage with loss of consciousness of 30 minutes or less, subsequent encounter: Secondary | ICD-10-CM

## 2014-01-03 DIAGNOSIS — E1151 Type 2 diabetes mellitus with diabetic peripheral angiopathy without gangrene: Secondary | ICD-10-CM

## 2014-01-03 DIAGNOSIS — I619 Nontraumatic intracerebral hemorrhage, unspecified: Secondary | ICD-10-CM | POA: Diagnosis present

## 2014-01-03 DIAGNOSIS — I798 Other disorders of arteries, arterioles and capillaries in diseases classified elsewhere: Secondary | ICD-10-CM

## 2014-01-03 HISTORY — DX: Unspecified osteoarthritis, unspecified site: M19.90

## 2014-01-03 HISTORY — DX: Essential (primary) hypertension: I10

## 2014-01-03 HISTORY — DX: Gastro-esophageal reflux disease without esophagitis: K21.9

## 2014-01-03 HISTORY — DX: Atherosclerotic heart disease of native coronary artery without angina pectoris: I25.10

## 2014-01-03 HISTORY — DX: Type 2 diabetes mellitus without complications: E11.9

## 2014-01-03 HISTORY — DX: Hyperlipidemia, unspecified: E78.5

## 2014-01-03 LAB — PROTIME-INR
INR: 1.07 (ref 0.00–1.49)
Prothrombin Time: 14 seconds (ref 11.6–15.2)

## 2014-01-03 LAB — COMPREHENSIVE METABOLIC PANEL
ALT: 47 U/L (ref 0–53)
AST: 49 U/L — ABNORMAL HIGH (ref 0–37)
Albumin: 3.3 g/dL — ABNORMAL LOW (ref 3.5–5.2)
Alkaline Phosphatase: 67 U/L (ref 39–117)
Anion gap: 22 — ABNORMAL HIGH (ref 5–15)
BUN: 18 mg/dL (ref 6–23)
CO2: 24 mEq/L (ref 19–32)
Calcium: 9.1 mg/dL (ref 8.4–10.5)
Chloride: 94 mEq/L — ABNORMAL LOW (ref 96–112)
Creatinine, Ser: 1.04 mg/dL (ref 0.50–1.35)
GFR calc Af Amer: 81 mL/min — ABNORMAL LOW (ref >90)
GFR calc non Af Amer: 70 mL/min — ABNORMAL LOW (ref >90)
Glucose, Bld: 241 mg/dL — ABNORMAL HIGH (ref 70–99)
Potassium: 4.4 mEq/L (ref 3.7–5.3)
Sodium: 140 mEq/L (ref 137–147)
Total Bilirubin: 0.3 mg/dL (ref 0.3–1.2)
Total Protein: 6.9 g/dL (ref 6.0–8.3)

## 2014-01-03 LAB — CBC WITH DIFFERENTIAL/PLATELET
Basophils Absolute: 0 10*3/uL (ref 0.0–0.1)
Basophils Relative: 0 % (ref 0–1)
Eosinophils Absolute: 0 10*3/uL (ref 0.0–0.7)
Eosinophils Relative: 1 % (ref 0–5)
HCT: 37.6 % — ABNORMAL LOW (ref 39.0–52.0)
Hemoglobin: 12.9 g/dL — ABNORMAL LOW (ref 13.0–17.0)
Lymphocytes Relative: 12 % (ref 12–46)
Lymphs Abs: 1 10*3/uL (ref 0.7–4.0)
MCH: 30 pg (ref 26.0–34.0)
MCHC: 34.3 g/dL (ref 30.0–36.0)
MCV: 87.4 fL (ref 78.0–100.0)
Monocytes Absolute: 0.7 10*3/uL (ref 0.1–1.0)
Monocytes Relative: 9 % (ref 3–12)
Neutro Abs: 6.3 10*3/uL (ref 1.7–7.7)
Neutrophils Relative %: 78 % — ABNORMAL HIGH (ref 43–77)
Platelets: 156 10*3/uL (ref 150–400)
RBC: 4.3 MIL/uL (ref 4.22–5.81)
RDW: 12.9 % (ref 11.5–15.5)
WBC: 8.1 10*3/uL (ref 4.0–10.5)

## 2014-01-03 LAB — TROPONIN I: Troponin I: 1.33 ng/mL (ref <0.30)

## 2014-01-03 LAB — MRSA PCR SCREENING: MRSA BY PCR: NEGATIVE

## 2014-01-03 LAB — GLUCOSE, CAPILLARY: Glucose-Capillary: 223 mg/dL — ABNORMAL HIGH (ref 70–99)

## 2014-01-03 LAB — MAGNESIUM: Magnesium: 2.1 mg/dL (ref 1.5–2.5)

## 2014-01-03 LAB — TSH: TSH: 1.67 u[IU]/mL (ref 0.350–4.500)

## 2014-01-03 MED ORDER — ONDANSETRON HCL 4 MG/2ML IJ SOLN: 4.0000 mg | Freq: Four times a day (QID) | INTRAMUSCULAR | Status: DC | PRN

## 2014-01-03 MED ORDER — NITROGLYCERIN 2 % TD OINT
0.5000 [in_us] | TOPICAL_OINTMENT | Freq: Three times a day (TID) | TRANSDERMAL | Status: DC
  Administered 2014-01-03 – 2014-01-06 (×7): 0.5 [in_us] via TOPICAL
  Filled 2014-01-03: qty 30

## 2014-01-03 MED ORDER — ACETAMINOPHEN 325 MG PO TABS
650.0000 mg | ORAL_TABLET | ORAL | Status: DC | PRN
  Administered 2014-01-04 – 2014-01-08 (×7): 650 mg via ORAL
  Filled 2014-01-03 (×7): qty 2

## 2014-01-03 MED ORDER — ATORVASTATIN CALCIUM 20 MG PO TABS
20.0000 mg | ORAL_TABLET | Freq: Every day | ORAL | Status: DC
  Administered 2014-01-03 – 2014-01-04 (×2): 20 mg via ORAL
  Filled 2014-01-03 (×3): qty 1

## 2014-01-03 MED ORDER — INSULIN ASPART 100 UNIT/ML ~~LOC~~ SOLN
0.0000 [IU] | Freq: Three times a day (TID) | SUBCUTANEOUS | Status: DC
  Administered 2014-01-04: 3 [IU] via SUBCUTANEOUS
  Administered 2014-01-04: 2 [IU] via SUBCUTANEOUS
  Administered 2014-01-04: 3 [IU] via SUBCUTANEOUS
  Administered 2014-01-05: 2 [IU] via SUBCUTANEOUS
  Administered 2014-01-05: 3 [IU] via SUBCUTANEOUS
  Administered 2014-01-06 (×2): 5 [IU] via SUBCUTANEOUS
  Administered 2014-01-06: 3 [IU] via SUBCUTANEOUS
  Administered 2014-01-07: 8 [IU] via SUBCUTANEOUS
  Administered 2014-01-07 (×2): 5 [IU] via SUBCUTANEOUS
  Administered 2014-01-08: 8 [IU] via SUBCUTANEOUS
  Administered 2014-01-08: 15 [IU] via SUBCUTANEOUS

## 2014-01-03 MED ORDER — PANTOPRAZOLE SODIUM 40 MG PO TBEC
40.0000 mg | DELAYED_RELEASE_TABLET | Freq: Every day | ORAL | Status: DC
  Administered 2014-01-03 – 2014-01-08 (×6): 40 mg via ORAL
  Filled 2014-01-03 (×4): qty 1

## 2014-01-03 MED ORDER — SODIUM CHLORIDE 0.9 % IV SOLN
INTRAVENOUS | Status: DC
  Administered 2014-01-03 – 2014-01-04 (×2): via INTRAVENOUS

## 2014-01-03 MED ORDER — NITROGLYCERIN 0.4 MG SL SUBL
0.4000 mg | SUBLINGUAL_TABLET | SUBLINGUAL | Status: DC | PRN
  Filled 2014-01-03: qty 1

## 2014-01-03 MED ORDER — METOPROLOL TARTRATE 25 MG PO TABS
25.0000 mg | ORAL_TABLET | Freq: Two times a day (BID) | ORAL | Status: DC
  Administered 2014-01-03 – 2014-01-08 (×10): 25 mg via ORAL
  Filled 2014-01-03 (×11): qty 1

## 2014-01-03 MED ORDER — INSULIN ASPART 100 UNIT/ML ~~LOC~~ SOLN
0.0000 [IU] | Freq: Every day | SUBCUTANEOUS | Status: DC
  Administered 2014-01-03: 3 [IU] via SUBCUTANEOUS
  Administered 2014-01-04: 1 [IU] via SUBCUTANEOUS
  Administered 2014-01-05 – 2014-01-07 (×3): 2 [IU] via SUBCUTANEOUS

## 2014-01-03 NOTE — Progress Notes (Signed)
eLink Physician-Brief Progress Note Patient Name: VALENTINO SAAVEDRA DOB: Sep 19, 1940 MRN: [id number]   Date of Service  01/03/2014  HPI/Events of Note    eICU Interventions  SCDs placed     Intervention Category Intermediate Interventions: Best-practice therapies (e.g. DVT, beta blocker, etc.)  Caytlyn Evers S. 01/03/2014, 9:30 PM
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
## 2014-01-03 NOTE — Progress Notes (Signed)
CRITICAL VALUE ALERT  Critical value received:  Troponin 1.33   Date of notification:  01/03/2014  Time of notification:  2130  Critical value read back:Yes.    Nurse who received alert:  Rosebud Poles RN BSN  MD notified (1st page):  Kerin Ransom   Time of first page:  2134  MD notified (2nd page):  Time of second page:  Responding MD: Kerin Ransom  Time MD responded:  2134

## 2014-01-04 ENCOUNTER — Encounter (HOSPITAL_COMMUNITY)
Admission: AD | Disposition: A | Discharge status: Home / Self Care | Payer: Self-pay | Source: Other Acute Inpatient Hospital | Attending: Cardiology

## 2014-01-04 ENCOUNTER — Ambulatory Visit (HOSPITAL_COMMUNITY): Admission: EM | Admit: 2014-01-04 | Payer: Self-pay | Admitting: Cardiovascular Disease

## 2014-01-04 ENCOUNTER — Inpatient Hospital Stay (HOSPITAL_COMMUNITY): Payer: PRIVATE HEALTH INSURANCE

## 2014-01-04 LAB — BASIC METABOLIC PANEL
Anion gap: 11 (ref 5–15)
BUN: 15 mg/dL (ref 6–23)
CO2: 26 mEq/L (ref 19–32)
Calcium: 9 mg/dL (ref 8.4–10.5)
Chloride: 102 mEq/L (ref 96–112)
Creatinine, Ser: 0.92 mg/dL (ref 0.50–1.35)
GFR calc Af Amer: >90 mL/min (ref >90)
GFR calc non Af Amer: 82 mL/min — ABNORMAL LOW (ref >90)
Glucose, Bld: 165 mg/dL — ABNORMAL HIGH (ref 70–99)
Potassium: 4 mEq/L (ref 3.7–5.3)
Sodium: 139 mEq/L (ref 137–147)

## 2014-01-04 LAB — GLUCOSE, CAPILLARY
Glucose-Capillary: 172 mg/dL — ABNORMAL HIGH (ref 70–99)
Glucose-Capillary: 171 mg/dL — ABNORMAL HIGH (ref 70–99)
Glucose-Capillary: 141 mg/dL — ABNORMAL HIGH (ref 70–99)
Glucose-Capillary: 204 mg/dL — ABNORMAL HIGH (ref 70–99)

## 2014-01-04 LAB — CBC
HCT: 40 % (ref 39.0–52.0)
Hemoglobin: 13.3 g/dL (ref 13.0–17.0)
MCH: 30 pg (ref 26.0–34.0)
MCHC: 33.3 g/dL (ref 30.0–36.0)
MCV: 90.1 fL (ref 78.0–100.0)
Platelets: 168 10*3/uL (ref 150–400)
RBC: 4.44 MIL/uL (ref 4.22–5.81)
RDW: 12.8 % (ref 11.5–15.5)
WBC: 7.5 10*3/uL (ref 4.0–10.5)

## 2014-01-04 LAB — HEMOGLOBIN A1C
Hgb A1c MFr Bld: 7.5 % — ABNORMAL HIGH (ref <5.7)
Mean Plasma Glucose: 169 mg/dL — ABNORMAL HIGH (ref <117)

## 2014-01-04 LAB — TROPONIN I
Troponin I: 1.34 ng/mL (ref <0.30)
Troponin I: 1.26 ng/mL (ref <0.30)

## 2014-01-04 SURGERY — LEFT HEART CATHETERIZATION WITH CORONARY ANGIOGRAM
Anesthesia: LOCAL

## 2014-01-04 MED ORDER — SODIUM CHLORIDE 0.9 % IV SOLN
INTRAVENOUS | Status: DC
  Administered 2014-01-05: 04:00:00 via INTRAVENOUS

## 2014-01-04 NOTE — Progress Notes (Addendum)
NEURO HOSPITALIST PROGRESS NOTE   SUBJECTIVE:                                                                                                                        Trevor [name] is resting comfortably in bed. Family is at the bedside. Stated that he is " here, doing well". He answers questions appropriately and offers no neurological complains except for a mild frontal HA. CT brain performed yesterday at [facility] showed multiple areas of subarachnoid hemorrhage including left temporal and bifrontal regions. There was also a left posterior frontal area of subarachnoid hemorrhage with a possible subdural component.   OBJECTIVE:                                                                                                                           Vital signs in last 24 hours: Temp:  [97.2 F (36.2 C)-97.6 F (36.4 C)] 97.2 F (36.2 C) (10/20 0724) Pulse Rate:  [63-91] 65 (10/20 0800) Resp:  [10-22] 10 (10/20 0800) BP: (99-155)/(38-85) 122/56 mmHg (10/20 0800) SpO2:  [93 %-99 %] 93 % (10/20 0800) Weight:  [112.9 kg (248 lb 14.4 oz)-115.8 kg (255 lb 4.7 oz)] 115.8 kg (255 lb 4.7 oz) (10/20 0500)  Intake/Output from previous day: 10/19 0701 - 10/20 0700 In: 640 [I.V.:640] Out: -  Intake/Output this shift: Total I/O In: 75 [I.V.:75] Out: 800 [Urine:800] Nutritional status: NPO  Past Medical History  Diagnosis Date  . CAD (coronary artery disease)   . Diabetes mellitus   . HTN (hypertension)   . Dyslipidemia     Physical exam: pleasant male in no apparent distress. Head: normocephalic. Neck: supple, no bruits, no JVD. Cardiac: no murmurs. Lungs: clear. Abdomen: soft, no tender, no mass. Extremities: no edema.   Neurologic Exam:  General: Mental Status: Alert, oriented, thought content appropriate.  Speech fluent without evidence of aphasia.  Able to follow 3 step commands without difficulty. Cranial Nerves: II: Discs flat  bilaterally; Visual fields grossly normal, pupils equal, round, reactive to light and accommodation III,IV, VI: ptosis not present, extra-ocular motions intact bilaterally V,VII: smile symmetric, facial light touch sensation normal bilaterally VIII: hearing normal bilaterally IX,X: gag reflex present XI: bilateral shoulder shrug XII: midline tongue  extension without atrophy or fasciculations  Motor: Right : Upper extremity   5/5    Left:     Upper extremity   5/5  Lower extremity   5/5     Lower extremity   5/5 Tone and bulk:normal tone throughout; no atrophy noted Sensory: Pinprick and light touch intact throughout, bilaterally Deep Tendon Reflexes:  Right: Upper Extremity   Left: Upper extremity   biceps (C-5 to C-6) 2/4   biceps (C-5 to C-6) 2/4 tricep (C7) 2/4    triceps (C7) 2/4 Brachioradialis (C6) 2/4  Brachioradialis (C6) 2/4  Lower Extremity Lower Extremity  quadriceps (L-2 to L-4) 2/4   quadriceps (L-2 to L-4) 2/4 Achilles (S1) 2/4   Achilles (S1) 2/4  Plantars: Right: downgoing   Left: downgoing Cerebellar: normal finger-to-nose,  normal heel-to-shin test Gait:  No tested CV: pulses palpable throughout    Lab Results: No results found for this basename: cbc, bmp, coags, chol, tri, ldl, hga1c   Lipid Panel No results found for this basename: CHOL, TRIG, HDL, CHOLHDL, VLDL, LDLCALC,  in the last 72 hours  Studies/Results: No results found.  MEDICATIONS                                                                                                                        Scheduled: . atorvastatin  20 mg Oral q1800  . insulin aspart  0-15 Units Subcutaneous TID WC  . insulin aspart  0-5 Units Subcutaneous QHS  . metoprolol tartrate  25 mg Oral BID  . nitroGLYCERIN  0.5 inch Topical 3 times per day  . pantoprazole  40 mg Oral Daily    ASSESSMENT/PLAN:                                                                                                             73 year old man transferred to [facility] after sustaining a cardiac arrest and closed head injury, with mild confusion and short-term memory difficulty as well as multiple areas of subarachnoid hemorrhage, as well as possible left posterior frontal subdural hemorrhage. At this time he is neurologically intact. Follow up CT head shows resolving SDH and redistribution of SAH with small amount of intraventricular blood. At this time patient should be stable for diagnostic cardiac catheterization but would not recommend any form of anticoagulation.   Dorian Pod, MD [facility] [phone]  01/04/2014, 10:57 AM

## 2014-01-04 NOTE — Progress Notes (Signed)
Inpatient Diabetes Program Recommendations  AACE/ADA: New Consensus Statement on Inpatient Glycemic Control (2013)  Target Ranges:  Prepandial:   less than 140 mg/dL      Peak postprandial:   less than 180 mg/dL (1-2 hours)      Critically ill patients:  140 - 180 mg/dL  Results for Trevor [name], Trevor [name] (MRN [id number]) as of 01/04/2014 09:00  Ref. Range 01/03/2014 21:34 01/04/2014 07:27  Glucose-Capillary Latest Range: 70-99 mg/dL 223 (H) 172 (H)   Consider using ICU Hyperglycemia Protocol. If not, change Novolog to Q4 instead of TID ac since NPO.   Thank you  Raoul Pitch BSN, RN,CDE Inpatient Diabetes Coordinator [phone] (team pager)
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
## 2014-01-04 NOTE — Progress Notes (Signed)
Utilization Review Completed.Donne Anon T10/20/2015

## 2014-01-05 ENCOUNTER — Encounter (HOSPITAL_COMMUNITY)
Admission: AD | Disposition: A | Discharge status: Home / Self Care | Payer: Self-pay | Source: Other Acute Inpatient Hospital | Attending: Cardiology

## 2014-01-05 DIAGNOSIS — I2510 Atherosclerotic heart disease of native coronary artery without angina pectoris: Secondary | ICD-10-CM

## 2014-01-05 DIAGNOSIS — I517 Cardiomegaly: Secondary | ICD-10-CM

## 2014-01-05 HISTORY — PX: LEFT HEART CATHETERIZATION WITH CORONARY ANGIOGRAM: SHX5451

## 2014-01-05 LAB — GLUCOSE, CAPILLARY
GLUCOSE-CAPILLARY: 164 mg/dL — AB (ref 70–99)
Glucose-Capillary: 171 mg/dL — ABNORMAL HIGH (ref 70–99)
Glucose-Capillary: 148 mg/dL — ABNORMAL HIGH (ref 70–99)
Glucose-Capillary: 138 mg/dL — ABNORMAL HIGH (ref 70–99)
Glucose-Capillary: 215 mg/dL — ABNORMAL HIGH (ref 70–99)

## 2014-01-05 SURGERY — LEFT HEART CATHETERIZATION WITH CORONARY ANGIOGRAM
Anesthesia: LOCAL

## 2014-01-05 MED ORDER — FUROSEMIDE 40 MG PO TABS
40.0000 mg | ORAL_TABLET | Freq: Every day | ORAL | Status: DC
  Administered 2014-01-05 – 2014-01-08 (×4): 40 mg via ORAL
  Filled 2014-01-05 (×4): qty 1

## 2014-01-05 MED ORDER — FENTANYL CITRATE 0.05 MG/ML IJ SOLN
INTRAMUSCULAR | Status: AC
  Filled 2014-01-05: qty 2

## 2014-01-05 MED ORDER — NITROGLYCERIN 1 MG/10 ML FOR IR/CATH LAB
INTRA_ARTERIAL | Status: AC
  Filled 2014-01-05: qty 10

## 2014-01-05 MED ORDER — LIDOCAINE HCL (PF) 1 % IJ SOLN
INTRAMUSCULAR | Status: AC
  Filled 2014-01-05: qty 30

## 2014-01-05 MED ORDER — FUROSEMIDE 40 MG PO TABS
40.0000 mg | ORAL_TABLET | Freq: Every day | ORAL | Status: DC
  Filled 2014-01-05: qty 1

## 2014-01-05 MED ORDER — MIDAZOLAM HCL 2 MG/2ML IJ SOLN
INTRAMUSCULAR | Status: AC
  Filled 2014-01-05: qty 2

## 2014-01-05 MED ORDER — SODIUM CHLORIDE 0.9 % IV SOLN: INTRAVENOUS | Status: AC

## 2014-01-05 MED ORDER — ATORVASTATIN CALCIUM 40 MG PO TABS
40.0000 mg | ORAL_TABLET | Freq: Every day | ORAL | Status: DC
  Administered 2014-01-05 – 2014-01-07 (×3): 40 mg via ORAL
  Filled 2014-01-05 (×5): qty 1

## 2014-01-05 NOTE — Progress Notes (Signed)
Site area: rt groin Site Prior to Removal:  Level 0 Pressure Applied For: 20 minutes Manual:   yes Patient Status During Pull:  stable Post Pull Site:  Level 0 Post Pull Instructions Given:  yes Post Pull Pulses Present: yes Dressing Applied:  tegaderm Bedrest begins @ 9357 Comments: no complications

## 2014-01-05 NOTE — Interval H&P Note (Signed)
Cath Lab Visit (complete for each Cath Lab visit)  Clinical Evaluation Leading to the Procedure:   ACS: Yes.    Non-ACS:    Anginal Classification: CCS IV  Anti-ischemic medical therapy: Minimal Therapy (1 class of medications)  Non-Invasive Test Results: No non-invasive testing performed  Prior CABG: No previous CABG      History and Physical Interval Note:  01/05/2014 2:04 PM  Trevor [name]  has presented today for surgery, with the diagnosis of MI, VF arrest  The various methods of treatment have been discussed with the patient and family. After consideration of risks, benefits and other options for treatment, the patient has consented to  Procedure(s): LEFT HEART CATHETERIZATION WITH CORONARY ANGIOGRAM (N/A) as a surgical intervention .  The patient's history has been reviewed, patient examined, no change in status, stable for surgery.  I have reviewed the patient's chart and labs.  Questions were answered to the patient's satisfaction.     Kathlyn Sacramento

## 2014-01-05 NOTE — Progress Notes (Signed)
  Echocardiogram 2D Echocardiogram has been performed.  Trevor [name] 01/05/2014, 8:40 AM

## 2014-01-05 NOTE — CV Procedure (Signed)
    Cardiac Catheterization Procedure Note  Name: Trevor [name] MRN: [id number] DOB: 29-Nov-1940  Procedure: Left Heart Cath, Selective Coronary Angiography, LV angiography  Indication:  Ventricular fibrillation arrest with a small non-ST elevation myocardial infarction. The patient had subdural hematoma and thus, No anticoagulation was given..   Medications:  Sedation:  1 mg IV Versed, 25 mcg IV Fentanyl  Contrast:  70 mL Omnipaque  Procedural details: The right groin was prepped, draped, and anesthetized with 1% lidocaine. Using modified Seldinger technique, a 5 French sheath was introduced into the right femoral artery. Standard Judkins catheters were used for coronary angiography and left ventriculography. Catheter exchanges were performed over a guidewire. There were no immediate procedural complications. The patient was transferred to the post catheterization recovery area for further monitoring.   Procedural Findings:  Hemodynamics: AO:  162/79   mmHg LV:  168/21    mmHg LVEDP: 30  mmHg  Coronary angiography: Coronary dominance: Right   Left Main:  Moderately calcified with no obstructive disease.  Left Anterior Descending (LAD):  Heavily calcified especially in the proximal segment. There is 50% proximal LAD stenosis. There is diffuse 30% disease throughout the mid and distal segment with no evidence of obstructive disease.  1st diagonal (D1):  Normal in size with 80% ostial stenosis  2nd diagonal (D2):  Normal in size with diffuse 30% proximal disease.  3rd diagonal (D3):  Very small in size with minor irregularities.  Circumflex (LCx):  Normal in size. There is 50% tubular stenosis in the midsegment. The rest of the vessel has minor irregularities.  1st obtuse marginal:  Very small in size.  2nd obtuse marginal:  Normal in size with minor irregularities.  3rd obtuse marginal:  Normal in size with minor irregularities.   AV groove continuation segment: Normal  in size with minor irregularities  Right Coronary Artery: Normal in size and dominant. The vessel is heavily calcified with diffuse 80% disease proximally and occlusion in the midsegment. The stent is noted distally. There are well-developed left-to-right collaterals.  Left ventriculography: Left ventricular systolic function is mildly reduced , LVEF is estimated at 45 %, there is no significant mitral regurgitation . Moderate basal inferior wall hypokinesis  Final Conclusions:   1. 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 There is moderate disease involving the LAD and left circumflex. Carotid arteries are overall heavily calcified. The ostial first diagonal appears to have significant stenosis but I doubt that this would be the culprit for ventricular fibrillation. 2. Mild reduced LV systolic function with an ejection fraction of 45%. 3. Moderately elevated left ventricular end-diastolic pressure.  Recommendations:  I don't see a culprit for an acute ischemic event leading to ventricular fibrillation based on today's angiogram. I recommend continuing medical therapy. Given elevated left ventricular end-diastolic pressure, I added small dose furosemide. Continue aggressive medical therapy for coronary artery disease.  Kathlyn Sacramento MD, [facility] 01/05/2014, 2:38 PM
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
## 2014-01-06 ENCOUNTER — Encounter (HOSPITAL_COMMUNITY): Payer: Self-pay | Admitting: *Deleted

## 2014-01-06 DIAGNOSIS — I255 Ischemic cardiomyopathy: Secondary | ICD-10-CM

## 2014-01-06 DIAGNOSIS — I5022 Chronic systolic (congestive) heart failure: Secondary | ICD-10-CM

## 2014-01-06 DIAGNOSIS — I472 Ventricular tachycardia: Secondary | ICD-10-CM

## 2014-01-06 LAB — GLUCOSE, CAPILLARY
GLUCOSE-CAPILLARY: 183 mg/dL — AB (ref 70–99)
Glucose-Capillary: 214 mg/dL — ABNORMAL HIGH (ref 70–99)
Glucose-Capillary: 245 mg/dL — ABNORMAL HIGH (ref 70–99)
Glucose-Capillary: 240 mg/dL — ABNORMAL HIGH (ref 70–99)

## 2014-01-06 LAB — BASIC METABOLIC PANEL
Anion gap: 14 (ref 5–15)
BUN: 17 mg/dL (ref 6–23)
CHLORIDE: 99 meq/L (ref 96–112)
CO2: 23 mEq/L (ref 19–32)
Calcium: 9.2 mg/dL (ref 8.4–10.5)
Creatinine, Ser: 0.93 mg/dL (ref 0.50–1.35)
GFR calc Af Amer: >90 mL/min (ref >90)
GFR calc non Af Amer: 82 mL/min — ABNORMAL LOW (ref >90)
GLUCOSE: 203 mg/dL — AB (ref 70–99)
POTASSIUM: 4.2 meq/L (ref 3.7–5.3)
Sodium: 136 mEq/L — ABNORMAL LOW (ref 137–147)

## 2014-01-06 LAB — LIPID PANEL
CHOL/HDL RATIO: 7.8 ratio
Cholesterol: 195 mg/dL (ref 0–200)
HDL: 25 mg/dL — AB (ref >39)
LDL CALC: 113 mg/dL — AB (ref 0–99)
TRIGLYCERIDES: 287 mg/dL — AB (ref <150)
VLDL: 57 mg/dL — AB (ref 0–40)

## 2014-01-06 MED ORDER — INSULIN GLARGINE 100 UNIT/ML ~~LOC~~ SOLN
25.0000 [IU] | Freq: Every day | SUBCUTANEOUS | Status: DC
  Administered 2014-01-06 – 2014-01-07 (×2): 25 [IU] via SUBCUTANEOUS
  Filled 2014-01-06 (×3): qty 0.25

## 2014-01-06 MED ORDER — LISINOPRIL 10 MG PO TABS
10.0000 mg | ORAL_TABLET | Freq: Every day | ORAL | Status: DC
  Administered 2014-01-06 – 2014-01-08 (×3): 10 mg via ORAL
  Filled 2014-01-06 (×3): qty 1
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
## 2014-01-06 NOTE — Progress Notes (Signed)
Patient ambulated to end of hall and back to room with minimal assist. Denies SOB. RN will continue to monitor. Jyquan Kenley, RN 
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
## 2014-01-07 DIAGNOSIS — I472 Ventricular tachycardia: Secondary | ICD-10-CM

## 2014-01-07 DIAGNOSIS — I4729 Other ventricular tachycardia: Secondary | ICD-10-CM

## 2014-01-07 LAB — BASIC METABOLIC PANEL
ANION GAP: 13 (ref 5–15)
BUN: 16 mg/dL (ref 6–23)
CHLORIDE: 95 meq/L — AB (ref 96–112)
CO2: 28 mEq/L (ref 19–32)
CREATININE: 1.09 mg/dL (ref 0.50–1.35)
Calcium: 9.6 mg/dL (ref 8.4–10.5)
GFR, EST AFRICAN AMERICAN: 76 mL/min — AB (ref >90)
GFR, EST NON AFRICAN AMERICAN: 66 mL/min — AB (ref >90)
Glucose, Bld: 189 mg/dL — ABNORMAL HIGH (ref 70–99)
POTASSIUM: 3.7 meq/L (ref 3.7–5.3)
Sodium: 136 mEq/L — ABNORMAL LOW (ref 137–147)

## 2014-01-07 LAB — GLUCOSE, CAPILLARY
GLUCOSE-CAPILLARY: 269 mg/dL — AB (ref 70–99)
GLUCOSE-CAPILLARY: 238 mg/dL — AB (ref 70–99)
Glucose-Capillary: 203 mg/dL — ABNORMAL HIGH (ref 70–99)
Glucose-Capillary: 221 mg/dL — ABNORMAL HIGH (ref 70–99)

## 2014-01-07 MED ORDER — METOPROLOL TARTRATE 25 MG PO TABS: 25.0000 mg | ORAL_TABLET | Freq: Two times a day (BID) | ORAL | Status: DC

## 2014-01-07 NOTE — Progress Notes (Signed)
  Discharge is near complete. Patient awaiting LifeVest. Order is being processed. Med rec is complete. Discharge summary pended. Will need to be signed and updated when discharge to home order is placed.   [name], Trevor 01/07/2014
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
## 2014-01-08 LAB — GLUCOSE, CAPILLARY
Glucose-Capillary: 265 mg/dL — ABNORMAL HIGH (ref 70–99)
Glucose-Capillary: 366 mg/dL — ABNORMAL HIGH (ref 70–99)

## 2014-01-08 LAB — BASIC METABOLIC PANEL
ANION GAP: 14 (ref 5–15)
BUN: 16 mg/dL (ref 6–23)
CALCIUM: 9.7 mg/dL (ref 8.4–10.5)
CO2: 26 mEq/L (ref 19–32)
Chloride: 97 mEq/L (ref 96–112)
Creatinine, Ser: 1.1 mg/dL (ref 0.50–1.35)
GFR, EST AFRICAN AMERICAN: 75 mL/min — AB (ref >90)
GFR, EST NON AFRICAN AMERICAN: 65 mL/min — AB (ref >90)
Glucose, Bld: 201 mg/dL — ABNORMAL HIGH (ref 70–99)
Potassium: 4 mEq/L (ref 3.7–5.3)
SODIUM: 137 meq/L (ref 137–147)

## 2014-01-08 NOTE — Progress Notes (Signed)
Patient ID: Trevor [name], male   DOB: 04-Dec-1940, 73 y.o.   MRN: [id number]      Subjective:    No complaints this AM   Objective:   Temp:  [97.7 F (36.5 C)-98.2 F (36.8 C)] 97.7 F (36.5 C) (10/24 0526) Pulse Rate:  [54-67] 67 (10/24 0526) Resp:  [18-20] 18 (10/24 0526) BP: (132-155)/(67-72) 155/67 mmHg (10/24 0526) SpO2:  [95 %-99 %] 99 % (10/24 0526) Weight:  [238 lb 8 oz (108.183 kg)] 238 lb 8 oz (108.183 kg) (10/24 0526)    Filed Weights   01/06/14 1024 01/07/14 0542 01/08/14 0526  Weight: 244 lb 0.8 oz (110.7 kg) 239 lb 6.7 oz (108.6 kg) 238 lb 8 oz (108.183 kg)    Intake/Output Summary (Last 24 hours) at 01/08/14 1301 Last data filed at 01/08/14 0100  Gross per 24 hour  Intake    600 ml  Output    800 ml  Net   -200 ml    Telemetry: None  Exam:  General: NAD  Resp: CTAB  Cardiac:RRR, no m/r/g  GI: abdomen soft, NT, ND  MSK: no LE edema  Neuro: no focal deficits  Psych: appropriate affect  Lab Results:  Basic Metabolic Panel:  Recent Labs Lab 01/03/14 2048  01/06/14 0900 01/07/14 0350 01/08/14 0314  NA 140  < > 136* 136* 137  K 4.4  < > 4.2 3.7 4.0  CL 94*  < > 99 95* 97  CO2 24  < > 23 28 26   GLUCOSE 241*  < > 203* 189* 201*  BUN 18  < > 17 16 16   CREATININE 1.04  < > 0.93 1.09 1.10  CALCIUM 9.1  < > 9.2 9.6 9.7  MG 2.1  --   --   --   --   < > = values in this interval not displayed.  Liver Function Tests:  Recent Labs Lab 01/03/14 2048  AST 49*  ALT 47  ALKPHOS 67  BILITOT 0.3  PROT 6.9  ALBUMIN 3.3*    CBC:  Recent Labs Lab 01/03/14 2048 01/04/14 0959  WBC 8.1 7.5  HGB 12.9* 13.3  HCT 37.6* 40.0  MCV 87.4 90.1  PLT 156 168    Cardiac Enzymes:  Recent Labs Lab 01/03/14 2048 01/04/14 0247 01/04/14 0959  TROPONINI 1.33* 1.34* 1.26*    BNP: No results found for this basename: PROBNP,  in the last 8760 hours  Coagulation:  Recent Labs Lab 01/03/14 2048  INR 1.07    ECG:   Medications:    Scheduled Medications: . atorvastatin  40 mg Oral q1800  . furosemide  40 mg Oral Daily  . insulin aspart  0-15 Units Subcutaneous TID WC  . insulin aspart  0-5 Units Subcutaneous QHS  . insulin glargine  25 Units Subcutaneous QHS  . lisinopril  10 mg Oral Daily  . metoprolol tartrate  25 mg Oral BID  . pantoprazole  40 mg Oral Daily     Infusions:     PRN Medications:  acetaminophen, nitroGLYCERIN, ondansetron (ZOFRAN) IV   01/05/14 Cath Coronary angiography:  Coronary dominance: Right  Left Main: Moderately calcified with no obstructive disease.  Left Anterior Descending (LAD): Heavily calcified especially in the proximal segment. There is 50% proximal LAD stenosis. There is diffuse 30% disease throughout the mid and distal segment with no evidence of obstructive disease.  1st diagonal (D1): Normal in size with 80% ostial stenosis  2nd diagonal (D2): Normal in size with  diffuse 30% proximal disease.  3rd diagonal (D3): Very small in size with minor irregularities.  Circumflex (LCx): Normal in size. There is 50% tubular stenosis in the midsegment. The rest of the vessel has minor irregularities.  1st obtuse marginal: Very small in size.  2nd obtuse marginal: Normal in size with minor irregularities.  3rd obtuse marginal: Normal in size with minor irregularities.  AV groove continuation segment: Normal in size with minor irregularities  Right Coronary Artery: Normal in size and dominant. The vessel is heavily calcified with diffuse 80% disease proximally and occlusion in the midsegment. The stent is noted distally. There are well-developed left-to-right collaterals. Left ventriculography: Left ventricular systolic function is mildly reduced , LVEF is estimated at 45 %, there is no significant mitral regurgitation . Moderate basal inferior wall hypokinesis  Final Conclusions:  1. Known one-vessel runoff disease with chronically occluded right coronary artery with left-to-right  collaterals. There is moderate disease involving the LAD and left circumflex. Carotid arteries are overall heavily calcified. The ostial first diagonal appears to have significant stenosis but I doubt that this would be the culprit for ventricular fibrillation.  2. Mild reduced LV systolic function with an ejection fraction of 45%.  3. Moderately elevated left ventricular end-diastolic pressure.  Recommendations:  I don't see a culprit for an acute ischemic event leading to ventricular fibrillation based on today's angiogram. I recommend continuing medical therapy. Given elevated left ventricular end-diastolic pressure, I added small dose furosemide. Continue aggressive medical therapy for coronary artery disease.    Assessment/Plan   1. Cardiac arrest - LVEF 60% by echo, cath as reported above, no lesions thought to be culprit.  - patient seen and evaluate by EP, plans were for discharge last night however his lifevest was not available at that time. Per EP plans for discharge with lifevest. Patient is considering whether he would want longterm ICD. He has follow up with Dr Rayann Heman in 2-3 weeks. Lifvest placed this morning, plan for discharge this AM        Carlyle Dolly, M.D.

## 2014-01-08 NOTE — Significant Event (Signed)
Zoll life vest teaching done, vest in place to patient ,discharge instructions given patient verbalized understanding . Patient discharged to home via w/c with belongings,  wife accompanied patient home. No c/o noted or voiced.

## 2014-02-04 ENCOUNTER — Encounter: Payer: Self-pay | Admitting: *Deleted

## 2014-02-04 ENCOUNTER — Ambulatory Visit (INDEPENDENT_AMBULATORY_CARE_PROVIDER_SITE_OTHER): Payer: PRIVATE HEALTH INSURANCE | Admitting: Internal Medicine

## 2014-02-04 ENCOUNTER — Encounter: Payer: Self-pay | Admitting: Internal Medicine

## 2014-02-04 VITALS — BP 138/70 | HR 49 | Ht 72.0 in | Wt 255.8 lb

## 2014-02-04 DIAGNOSIS — I2510 Atherosclerotic heart disease of native coronary artery without angina pectoris: Secondary | ICD-10-CM

## 2014-02-04 DIAGNOSIS — I255 Ischemic cardiomyopathy: Secondary | ICD-10-CM

## 2014-02-04 DIAGNOSIS — Z01818 Encounter for other preprocedural examination: Secondary | ICD-10-CM

## 2014-02-04 DIAGNOSIS — I10 Essential (primary) hypertension: Secondary | ICD-10-CM

## 2014-02-04 DIAGNOSIS — I469 Cardiac arrest, cause unspecified: Secondary | ICD-10-CM

## 2014-02-04 NOTE — Patient Instructions (Addendum)
Your physician has recommended that you have a defibrillator inserted. An implantable cardioverter defibrillator (ICD) is a small device that is placed in your chest or, in rare cases, your abdomen. This device uses electrical pulses or shocks to help control life-threatening, irregular heartbeats that could lead the heart to suddenly stop beating (sudden cardiac arrest). Leads are attached to the ICD that goes into your heart. This is done in the hospital and usually requires an overnight stay. Please see the instruction sheet given to you today for more information.  Your physician recommends that you return for lab work BMP/CBC/PT  PLEASE HOLD METFORMIN THE DAY BEFORE, THE DAY OF, AND 48 HOURS AFTER PROCEDURE  TAKE 1/2 DOSE OF INSULIN THE NIGHT BEFORE AND NONE THE DAY OF YOUR PROCEDURE  Thank you for choosing [facility]!!

## 2014-02-06 ENCOUNTER — Encounter: Payer: Self-pay | Admitting: Internal Medicine

## 2014-02-06 NOTE — Assessment & Plan Note (Signed)
His blood pressure is well controlled. He will continue his current meds. 

## 2014-02-06 NOTE — Assessment & Plan Note (Signed)
He has had no recurrent VF. He is at risk for recurrent VF in the setting of chronic CAD, not amenable to revascularization. He has no reversible cause. He was on medical therapy at the time of his event. I have discussed the treatment options with the patient. The risks/benefits/goals/expectations of ICD implant have been discussed with patient and he wishes to proceed.

## 2014-02-06 NOTE — Progress Notes (Signed)
HPI [name] returns today for followup. He is a pleasant 73 yo man with a h/o CAD, LV dysfunction who sustained a VF arrest several weeks ago. He underwent catheterization and was found to have a chronically occluded RCA. His RCA disease has been present since 2003 and his occluded RCA was documented 2008. His EF is 40-45%. He was not thought to have had an MI. He had a troponin of 1.46 which was due to his CPR/VF arrest. He had non-obstructive disease in the other vessels and no intervention was performed. He was given a life vest. He was continued on medical therapy for CAD. He has done well in the interim. He denies chest pain. He has class 2 dyspnea. He had not had syncope. He has worn his Life vest.  No Known Allergies   Current Outpatient Prescriptions  Medication Sig Dispense Refill  . aspirin 81 MG tablet Take 81 mg by mouth daily.    . clopidogrel (PLAVIX) 75 MG tablet Take 75 mg by mouth daily.    Marland Kitchen esomeprazole (NEXIUM) 40 MG capsule Take 40 mg by mouth daily at 12 noon.    Marland Kitchen glucosamine-chondroitin 500-400 MG tablet Take 1 tablet by mouth 3 (three) times daily.    . hydrochlorothiazide (HYDRODIURIL) 25 MG tablet Take 25 mg by mouth daily.    . insulin glargine (LANTUS) 100 UNIT/ML injection Inject 100 Units into the skin at bedtime. Wife states its 100 units per wife    . lisinopril (PRINIVIL,ZESTRIL) 40 MG tablet Take 40 mg by mouth daily.    . metFORMIN (GLUCOPHAGE) 1000 MG tablet Take 1,000 mg by mouth 2 (two) times daily with a meal.    . metoprolol tartrate (LOPRESSOR) 25 MG tablet Take 1 tablet (25 mg total) by mouth 2 (two) times daily. 60 tablet 5  . rosuvastatin (CRESTOR) 10 MG tablet Take 10 mg by mouth daily.     No current facility-administered medications for this visit.     Past Medical History  Diagnosis Date  . CAD (coronary artery disease)     a. RCA PCI in 2003 b. 2008-RCA was occluded; residual CAD- 70% CFX and 50% LAD with normal LVF c. low risk  Myoview in Dec 2011  . Diabetes mellitus   . HTN (hypertension)   . Dyslipidemia   . GERD (gastroesophageal reflux disease)   . Osteoarthritis     ROS:   All systems reviewed and negative except as noted in the HPI.   Past Surgical History  Procedure Laterality Date  . Coronary angioplasty  2003    RCA PCI  . Cardiac catheterization  2008    occluded RCA- med Rx  . Cataract extraction Right July 2011  . Cervical spine surgery  Feb 2010     Family History  Problem Relation Age of Onset  . Diabetes       History   Social History  . Marital Status: Married    Spouse Name: N/A    Number of Children: N/A  . Years of Education: N/A   Occupational History  . Not on file.   Social History Main Topics  . Smoking status: Former Research scientist (life sciences)  . Smokeless tobacco: Former Systems developer    Quit date: 01/04/1983  . Alcohol Use: Not on file  . Drug Use: Not on file  . Sexual Activity: Not on file   Other Topics Concern  . Not on file   Social History Narrative  BP 138/70 mmHg  Pulse 49  Ht 6' (1.829 m)  Wt 255 lb 12.8 oz (116.03 kg)  BMI 34.69 kg/m2  SpO2 95%  Physical Exam:  Well appearing 73 yo man, NAD HEENT: Unremarkable Neck:  No JVD, no thyromegally Back:  No CVA tenderness Lungs:  Clear with no wheezes HEART:  Regular rate rhythm, no murmurs, no rubs, no clicks Abd:  soft, positive bowel sounds, no organomegally, no rebound, no guarding Ext:  2 plus pulses, no edema, no cyanosis, no clubbing Skin:  No rashes no nodules Neuro:  CN II through XII intact, motor grossly intact   Assess/Plan:

## 2014-02-06 NOTE — Assessment & Plan Note (Signed)
He denies anginal symptoms. He will continue his current anti-anginal symptoms.

## 2014-02-15 DIAGNOSIS — I4901 Ventricular fibrillation: Secondary | ICD-10-CM | POA: Diagnosis present

## 2014-02-15 DIAGNOSIS — Z794 Long term (current) use of insulin: Secondary | ICD-10-CM | POA: Diagnosis not present

## 2014-02-15 DIAGNOSIS — I462 Cardiac arrest due to underlying cardiac condition: Secondary | ICD-10-CM | POA: Diagnosis not present

## 2014-02-15 DIAGNOSIS — I255 Ischemic cardiomyopathy: Secondary | ICD-10-CM | POA: Diagnosis not present

## 2014-02-15 DIAGNOSIS — I509 Heart failure, unspecified: Secondary | ICD-10-CM | POA: Diagnosis not present

## 2014-02-15 DIAGNOSIS — I10 Essential (primary) hypertension: Secondary | ICD-10-CM | POA: Diagnosis not present

## 2014-02-15 DIAGNOSIS — K219 Gastro-esophageal reflux disease without esophagitis: Secondary | ICD-10-CM | POA: Diagnosis not present

## 2014-02-15 DIAGNOSIS — Z955 Presence of coronary angioplasty implant and graft: Secondary | ICD-10-CM | POA: Diagnosis not present

## 2014-02-15 DIAGNOSIS — E119 Type 2 diabetes mellitus without complications: Secondary | ICD-10-CM | POA: Diagnosis not present

## 2014-02-15 DIAGNOSIS — I2510 Atherosclerotic heart disease of native coronary artery without angina pectoris: Secondary | ICD-10-CM | POA: Diagnosis not present

## 2014-02-15 DIAGNOSIS — I517 Cardiomegaly: Secondary | ICD-10-CM | POA: Diagnosis not present

## 2014-02-15 DIAGNOSIS — M1990 Unspecified osteoarthritis, unspecified site: Secondary | ICD-10-CM | POA: Diagnosis not present

## 2014-02-15 DIAGNOSIS — E785 Hyperlipidemia, unspecified: Secondary | ICD-10-CM | POA: Diagnosis not present

## 2014-02-15 DIAGNOSIS — I252 Old myocardial infarction: Secondary | ICD-10-CM | POA: Diagnosis not present

## 2014-02-15 DIAGNOSIS — Z87891 Personal history of nicotine dependence: Secondary | ICD-10-CM | POA: Diagnosis not present

## 2014-02-15 DIAGNOSIS — Z7982 Long term (current) use of aspirin: Secondary | ICD-10-CM | POA: Diagnosis not present

## 2014-02-15 LAB — CBC WITH DIFFERENTIAL/PLATELET
BASOS ABS: 0 10*3/uL (ref 0.0–0.1)
Basophils Relative: 0 % (ref 0–1)
EOS ABS: 0.2 10*3/uL (ref 0.0–0.7)
EOS PCT: 3 % (ref 0–5)
HCT: 43.7 % (ref 39.0–52.0)
Hemoglobin: 14.4 g/dL (ref 13.0–17.0)
Lymphocytes Relative: 25 % (ref 12–46)
Lymphs Abs: 1.8 10*3/uL (ref 0.7–4.0)
MCH: 29.2 pg (ref 26.0–34.0)
MCHC: 33 g/dL (ref 30.0–36.0)
MCV: 88.6 fL (ref 78.0–100.0)
MPV: 10.6 fL (ref 9.4–12.4)
Monocytes Absolute: 0.7 10*3/uL (ref 0.1–1.0)
Monocytes Relative: 9 % (ref 3–12)
Neutro Abs: 4.6 10*3/uL (ref 1.7–7.7)
Neutrophils Relative %: 63 % (ref 43–77)
PLATELETS: 205 10*3/uL (ref 150–400)
RBC: 4.93 MIL/uL (ref 4.22–5.81)
RDW: 13.5 % (ref 11.5–15.5)
WBC: 7.3 10*3/uL (ref 4.0–10.5)

## 2014-02-15 LAB — BASIC METABOLIC PANEL WITH GFR
BUN: 13 mg/dL (ref 6–23)
CALCIUM: 9.6 mg/dL (ref 8.4–10.5)
CO2: 28 meq/L (ref 19–32)
CREATININE: 1.02 mg/dL (ref 0.50–1.35)
Chloride: 98 mEq/L (ref 96–112)
GFR, Est African American: 84 mL/min
GFR, Est Non African American: 73 mL/min
GLUCOSE: 217 mg/dL — AB (ref 70–99)
Potassium: 5.2 mEq/L (ref 3.5–5.3)
Sodium: 135 mEq/L (ref 135–145)

## 2014-02-15 LAB — APTT: aPTT: 34 seconds (ref 24–37)

## 2014-02-15 MED ORDER — GENTAMICIN SULFATE 40 MG/ML IJ SOLN
80.0000 mg | INTRAMUSCULAR | Status: DC
  Filled 2014-02-15: qty 2

## 2014-02-15 MED ORDER — CEFAZOLIN SODIUM-DEXTROSE 2-3 GM-% IV SOLR: 2.0000 g | INTRAVENOUS | Status: DC

## 2014-02-16 ENCOUNTER — Encounter (HOSPITAL_COMMUNITY)
Admission: RE | Disposition: A | Discharge status: Home / Self Care | Payer: Self-pay | Source: Ambulatory Visit | Attending: Internal Medicine

## 2014-02-16 ENCOUNTER — Ambulatory Visit (HOSPITAL_COMMUNITY)
Admission: RE | Admit: 2014-02-16 | Discharge: 2014-02-17 | Disposition: A | Discharge status: Home / Self Care | Payer: PRIVATE HEALTH INSURANCE | Source: Ambulatory Visit | Attending: Internal Medicine | Admitting: Internal Medicine

## 2014-02-16 ENCOUNTER — Encounter (HOSPITAL_COMMUNITY): Payer: Self-pay | Admitting: *Deleted

## 2014-02-16 DIAGNOSIS — E785 Hyperlipidemia, unspecified: Secondary | ICD-10-CM | POA: Insufficient documentation

## 2014-02-16 DIAGNOSIS — I462 Cardiac arrest due to underlying cardiac condition: Secondary | ICD-10-CM | POA: Insufficient documentation

## 2014-02-16 DIAGNOSIS — I255 Ischemic cardiomyopathy: Secondary | ICD-10-CM

## 2014-02-16 DIAGNOSIS — E119 Type 2 diabetes mellitus without complications: Secondary | ICD-10-CM | POA: Insufficient documentation

## 2014-02-16 DIAGNOSIS — I2510 Atherosclerotic heart disease of native coronary artery without angina pectoris: Secondary | ICD-10-CM | POA: Insufficient documentation

## 2014-02-16 DIAGNOSIS — Z7982 Long term (current) use of aspirin: Secondary | ICD-10-CM | POA: Insufficient documentation

## 2014-02-16 DIAGNOSIS — E1151 Type 2 diabetes mellitus with diabetic peripheral angiopathy without gangrene: Secondary | ICD-10-CM

## 2014-02-16 DIAGNOSIS — I517 Cardiomegaly: Secondary | ICD-10-CM | POA: Insufficient documentation

## 2014-02-16 DIAGNOSIS — I4901 Ventricular fibrillation: Secondary | ICD-10-CM

## 2014-02-16 DIAGNOSIS — I252 Old myocardial infarction: Secondary | ICD-10-CM | POA: Insufficient documentation

## 2014-02-16 DIAGNOSIS — M1990 Unspecified osteoarthritis, unspecified site: Secondary | ICD-10-CM | POA: Insufficient documentation

## 2014-02-16 DIAGNOSIS — Z87891 Personal history of nicotine dependence: Secondary | ICD-10-CM | POA: Insufficient documentation

## 2014-02-16 DIAGNOSIS — Z794 Long term (current) use of insulin: Secondary | ICD-10-CM | POA: Insufficient documentation

## 2014-02-16 DIAGNOSIS — Z955 Presence of coronary angioplasty implant and graft: Secondary | ICD-10-CM | POA: Insufficient documentation

## 2014-02-16 DIAGNOSIS — I10 Essential (primary) hypertension: Secondary | ICD-10-CM | POA: Insufficient documentation

## 2014-02-16 DIAGNOSIS — I509 Heart failure, unspecified: Secondary | ICD-10-CM | POA: Insufficient documentation

## 2014-02-16 DIAGNOSIS — Z95810 Presence of automatic (implantable) cardiac defibrillator: Secondary | ICD-10-CM

## 2014-02-16 DIAGNOSIS — K219 Gastro-esophageal reflux disease without esophagitis: Secondary | ICD-10-CM | POA: Insufficient documentation

## 2014-02-16 HISTORY — PX: IMPLANTABLE CARDIOVERTER DEFIBRILLATOR IMPLANT: SHX5473

## 2014-02-16 HISTORY — DX: Ischemic cardiomyopathy: I25.5

## 2014-02-16 HISTORY — PX: IMPLANTABLE CARDIOVERTER DEFIBRILLATOR IMPLANT: SHX5860

## 2014-02-16 LAB — SURGICAL PCR SCREEN
MRSA, PCR: NEGATIVE
Staphylococcus aureus: NEGATIVE

## 2014-02-16 LAB — GLUCOSE, CAPILLARY
GLUCOSE-CAPILLARY: 191 mg/dL — AB (ref 70–99)
GLUCOSE-CAPILLARY: 185 mg/dL — AB (ref 70–99)
Glucose-Capillary: 209 mg/dL — ABNORMAL HIGH (ref 70–99)

## 2014-02-16 SURGERY — IMPLANTABLE CARDIOVERTER DEFIBRILLATOR IMPLANT
Anesthesia: LOCAL

## 2014-02-16 MED ORDER — ONDANSETRON HCL 4 MG/2ML IJ SOLN: 4.0000 mg | Freq: Four times a day (QID) | INTRAMUSCULAR | Status: DC | PRN

## 2014-02-16 MED ORDER — LIDOCAINE HCL (PF) 1 % IJ SOLN
INTRAMUSCULAR | Status: AC
  Filled 2014-02-16: qty 60

## 2014-02-16 MED ORDER — CHLORHEXIDINE GLUCONATE 4 % EX LIQD: 60.0000 mL | Freq: Once | CUTANEOUS | Status: DC

## 2014-02-16 MED ORDER — MIDAZOLAM HCL 5 MG/5ML IJ SOLN
INTRAMUSCULAR | Status: AC
  Filled 2014-02-16: qty 5

## 2014-02-16 MED ORDER — FENTANYL CITRATE 0.05 MG/ML IJ SOLN
INTRAMUSCULAR | Status: AC
  Filled 2014-02-16: qty 2

## 2014-02-16 MED ORDER — CEFAZOLIN SODIUM-DEXTROSE 2-3 GM-% IV SOLR
2.0000 g | Freq: Four times a day (QID) | INTRAVENOUS | Status: AC
Start: 2014-02-16 — End: 2014-02-17
  Administered 2014-02-16 – 2014-02-17 (×3): 2 g via INTRAVENOUS
  Filled 2014-02-16 (×3): qty 50

## 2014-02-16 MED ORDER — MUPIROCIN 2 % EX OINT
1.0000 "application " | TOPICAL_OINTMENT | Freq: Once | CUTANEOUS | Status: AC
  Administered 2014-02-16: 1 via TOPICAL

## 2014-02-16 MED ORDER — ACETAMINOPHEN 325 MG PO TABS: 325.0000 mg | ORAL_TABLET | ORAL | Status: DC | PRN

## 2014-02-16 MED ORDER — CEFAZOLIN SODIUM-DEXTROSE 2-3 GM-% IV SOLR
INTRAVENOUS | Status: AC
  Filled 2014-02-16: qty 50

## 2014-02-16 MED ORDER — INSULIN GLARGINE 100 UNIT/ML ~~LOC~~ SOLN
100.0000 [IU] | Freq: Every day | SUBCUTANEOUS | Status: DC
  Administered 2014-02-16: 100 [IU] via SUBCUTANEOUS
  Filled 2014-02-16 (×2): qty 1

## 2014-02-16 MED ORDER — SODIUM CHLORIDE 0.9 % IV SOLN
INTRAVENOUS | Status: DC
  Administered 2014-02-16: 12:00:00 via INTRAVENOUS

## 2014-02-16 MED ORDER — HEPARIN (PORCINE) IN NACL 2-0.9 UNIT/ML-% IJ SOLN
INTRAMUSCULAR | Status: AC
  Filled 2014-02-16: qty 500

## 2014-02-16 MED ORDER — CLOPIDOGREL BISULFATE 75 MG PO TABS: 75.0000 mg | ORAL_TABLET | Freq: Every day | ORAL | Status: DC

## 2014-02-16 MED ORDER — METOPROLOL TARTRATE 25 MG PO TABS
25.0000 mg | ORAL_TABLET | Freq: Two times a day (BID) | ORAL | Status: DC
  Administered 2014-02-16: 25 mg via ORAL
  Filled 2014-02-16: qty 1

## 2014-02-16 MED ORDER — CEFAZOLIN SODIUM 1-5 GM-% IV SOLN
INTRAVENOUS | Status: AC
  Filled 2014-02-16: qty 50

## 2014-02-16 MED ORDER — HYDROCHLOROTHIAZIDE 25 MG PO TABS: 25.0000 mg | ORAL_TABLET | Freq: Every day | ORAL | Status: DC

## 2014-02-16 MED ORDER — PANTOPRAZOLE SODIUM 40 MG PO TBEC: 40.0000 mg | DELAYED_RELEASE_TABLET | Freq: Every day | ORAL | Status: DC

## 2014-02-16 MED ORDER — MUPIROCIN 2 % EX OINT
TOPICAL_OINTMENT | CUTANEOUS | Status: AC
Start: 2014-02-16 — End: 2014-02-16
  Filled 2014-02-16: qty 22

## 2014-02-16 MED ORDER — LISINOPRIL 40 MG PO TABS: 40.0000 mg | ORAL_TABLET | Freq: Every day | ORAL | Status: DC

## 2014-02-16 MED ORDER — ROSUVASTATIN CALCIUM 10 MG PO TABS: 10.0000 mg | ORAL_TABLET | Freq: Every day | ORAL | Status: DC

## 2014-02-16 NOTE — H&P (View-Only) (Signed)
HPI [name] returns today for followup. He is a pleasant 73 yo man with a h/o CAD, LV dysfunction who sustained a VF arrest several weeks ago. He underwent catheterization and was found to have a chronically occluded RCA. His RCA disease has been present since 2003 and his occluded RCA was documented 2008. His EF is 40-45%. He was not thought to have had an MI. He had a troponin of 1.46 which was due to his CPR/VF arrest. He had non-obstructive disease in the other vessels and no intervention was performed. He was given a life vest. He was continued on medical therapy for CAD. He has done well in the interim. He denies chest pain. He has class 2 dyspnea. He had not had syncope. He has worn his Life vest.  No Known Allergies   Current Outpatient Prescriptions  Medication Sig Dispense Refill  . aspirin 81 MG tablet Take 81 mg by mouth daily.    . clopidogrel (PLAVIX) 75 MG tablet Take 75 mg by mouth daily.    Marland Kitchen esomeprazole (NEXIUM) 40 MG capsule Take 40 mg by mouth daily at 12 noon.    Marland Kitchen glucosamine-chondroitin 500-400 MG tablet Take 1 tablet by mouth 3 (three) times daily.    . hydrochlorothiazide (HYDRODIURIL) 25 MG tablet Take 25 mg by mouth daily.    . insulin glargine (LANTUS) 100 UNIT/ML injection Inject 100 Units into the skin at bedtime. Wife states its 100 units per wife    . lisinopril (PRINIVIL,ZESTRIL) 40 MG tablet Take 40 mg by mouth daily.    . metFORMIN (GLUCOPHAGE) 1000 MG tablet Take 1,000 mg by mouth 2 (two) times daily with a meal.    . metoprolol tartrate (LOPRESSOR) 25 MG tablet Take 1 tablet (25 mg total) by mouth 2 (two) times daily. 60 tablet 5  . rosuvastatin (CRESTOR) 10 MG tablet Take 10 mg by mouth daily.     No current facility-administered medications for this visit.     Past Medical History  Diagnosis Date  . CAD (coronary artery disease)     a. RCA PCI in 2003 b. 2008-RCA was occluded; residual CAD- 70% CFX and 50% LAD with normal LVF c. low risk  Myoview in Dec 2011  . Diabetes mellitus   . HTN (hypertension)   . Dyslipidemia   . GERD (gastroesophageal reflux disease)   . Osteoarthritis     ROS:   All systems reviewed and negative except as noted in the HPI.   Past Surgical History  Procedure Laterality Date  . Coronary angioplasty  2003    RCA PCI  . Cardiac catheterization  2008    occluded RCA- med Rx  . Cataract extraction Right July 2011  . Cervical spine surgery  Feb 2010     Family History  Problem Relation Age of Onset  . Diabetes       History   Social History  . Marital Status: Married    Spouse Name: N/A    Number of Children: N/A  . Years of Education: N/A   Occupational History  . Not on file.   Social History Main Topics  . Smoking status: Former Research scientist (life sciences)  . Smokeless tobacco: Former Systems developer    Quit date: 01/04/1983  . Alcohol Use: Not on file  . Drug Use: Not on file  . Sexual Activity: Not on file   Other Topics Concern  . Not on file   Social History Narrative  BP 138/70 mmHg  Pulse 49  Ht 6' (1.829 m)  Wt 255 lb 12.8 oz (116.03 kg)  BMI 34.69 kg/m2  SpO2 95%  Physical Exam:  Well appearing 73 yo man, NAD HEENT: Unremarkable Neck:  No JVD, no thyromegally Back:  No CVA tenderness Lungs:  Clear with no wheezes HEART:  Regular rate rhythm, no murmurs, no rubs, no clicks Abd:  soft, positive bowel sounds, no organomegally, no rebound, no guarding Ext:  2 plus pulses, no edema, no cyanosis, no clubbing Skin:  No rashes no nodules Neuro:  CN II through XII intact, motor grossly intact   Assess/Plan:

## 2014-02-16 NOTE — CV Procedure (Signed)
SURGEON:  Cristopher Peru, MD      PREPROCEDURE DIAGNOSES:   1. 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   2. New York Heart Association class II, heart failure chronically.      POSTPROCEDURE DIAGNOSES:   1. 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 New York Heart Association class III heart failure chronically.      PROCEDURES:    1. ICD implantation.  2. Defibrillation threshold testing     INTRODUCTION:  Trevor [name] is a 73 y.o. male with a prior VF arrest, ischemic CM (EF 40%), NYHA Class II CHF, and CAD. At this time, he meets the AVID criteria for ICD implantation for secondary prevention of sudden death.  The patient has a narrow QRS and does not meet criteria for revascularization.  The patient has been treated with an optimal medical regimen but continues to have a depressed ejection fraction and NYHA Class II CHF symptoms.  The patient therefore  presents today for ICD implantation.      DESCRIPTION OF PROCEDURE:  Informed written consent was obtained and the patient was brought to the electrophysiology lab in the fasting state. The patient was adequately sedated with intravenous Versed, and fentanyl as outlined in the nursing report.  The patient's left chest was prepped and draped in the usual sterile fashion by the EP lab staff.  The skin overlying the left deltopectoral region was infiltrated with lidocaine for local analgesia.  A 5-cm incision was made over the left deltopectoral region.  A left subcutaneous defibrillator pocket was fashioned using a combination of sharp and blunt dissection.  Electrocautery was used to assure hemostasis.   RA/RV Lead Placement: The right axillary vein was cannulated with fluoroscopic visualization.  No contrast was required for this endeavor.  Through the right axillary vein, a Medtronic [id number]  (serial # [id number]  ) right atrial lead and a Medtronic [id number]  (serial number [id number]) right ventricular defibrillator lead were advanced with fluoroscopic visualization into the right atrial appendage and right ventricular apical septal positions respectively.  Initial atrial lead P-waves measured 4.5 mV with an impedance of 591 ohms and a threshold of 0. volts at 0.5 milliseconds.  The right ventricular lead R-wave measured 10 mV with impedance of 772 ohms and a threshold of 1 volts at 0.5 milliseconds.   The leads were secured to the pectoralis  fascia using #2 silk suture over the suture sleeves.  The pocket then  irrigated with copious gentamicin solution.  The leads were then  connected to a medtronic (serial  Number [id number]) ICD.  The defibrillator was placed into the  pocket.  The pocket was then closed in 2 layers with 2.0 Vicryl suture  for the subcutaneous and subcuticular layers.  Steri-Strips and a  sterile dressing were then applied.   DFT Testing: Defibrillation Threshold testing was then performed. Ventricular fibrillation was induced with a T shock.  Adequate sensing of ventricular fibrillation was observed with minimal dropout with a programmed sensitivity of 1.98mV.  The patient was successfully defibrillated to sinus rhythm with a single 20 joules shock delivered from the device with an impedance of 76 ohms in a duration of 5 seconds.  The patient remained in sinus rhythm thereafter.  There were no early apparent complications.       CONCLUSIONS:   1. Ischemic cardiomyopathy with a VF cardiac arrest, not  associated withan MI, and chronic New York Heart Association class II heart failure.   2. Successful ICD implantation.   3. DFT less than or equal to 20 joules.   4. No early apparent complications.   Cristopher Peru, MD  2:02 PM 02/16/2014

## 2014-02-16 NOTE — Plan of Care (Signed)
Problem: Discharge Progression Outcomes Goal: Site without bleeding or hematoma Outcome: Completed/Met Date Met:  02/16/14 Goal: Hemodynamically stable Outcome: Completed/Met Date Met:  02/16/14 Goal: Pain controlled with appropriate interventions Outcome: Completed/Met Date Met:  02/16/14

## 2014-02-16 NOTE — H&P (Signed)
  ICD Criteria  Current LVEF:40% ;Obtained > or = 1 month ago and < or = 3 months ago.  NYHA Functional Classification: Class II  Heart Failure History:  Yes, Duration of heart failure since onset is 3 to 9 months  Non-Ischemic Dilated Cardiomyopathy History:  No.  Atrial Fibrillation/Atrial Flutter:  No.  Ventricular Tachycardia History:  Yes, Hemodynamic instability present, VT Type:  SVT - Polymorphic.  Cardiac Arrest History:  Yes, This was a Ventricular Tachycardia/Ventricular Fibrillation Arrest. This was NOT a bradycardia arrest.  History of Syndromes with Risk of Sudden Death:  No.  Previous ICD:  No.  Electrophysiology Study: No.  Prior MI:yes  PPM: No.  OSA:  No  Patient Life Expectancy of >=1 year: Yes.  Anticoagulation Therapy:  Patient is NOT on anticoagulation therapy.   Beta Blocker Therapy:  Yes.   Ace Inhibitor/ARB Therapy:  Yes.

## 2014-02-16 NOTE — Interval H&P Note (Signed)
History and Physical Interval Note:  02/16/2014 12:14 PM  Trevor [name]  has presented today for surgery, with the diagnosis of non-sustained VT  The various methods of treatment have been discussed with the patient and family. After consideration of risks, benefits and other options for treatment, the patient has consented to  Procedure(s): IMPLANTABLE CARDIOVERTER DEFIBRILLATOR IMPLANT (N/A) as a surgical intervention .  The patient's history has been reviewed, patient examined, no change in status, stable for surgery.  I have reviewed the patient's chart and labs.  Questions were answered to the patient's satisfaction.     Mikle Bosworth.D.

## 2014-02-16 NOTE — Discharge Summary (Signed)
ELECTROPHYSIOLOGY PROCEDURE DISCHARGE SUMMARY    Patient ID: Trevor [name],  MRN: [id number], DOB/AGE: 72-Sep-1942 72 y.o.  Admit date: 02/16/2014 Discharge date: 02/16/2014  Primary Care Physician: Gar Ponto, MD Primary Cardiologist: Ron Parker Electrophysiologist: Lovena Le  Primary Discharge Diagnosis:  VF arrest s/p ICD implant this admission  Secondary Discharge Diagnosis:  1.  CAD - RCA PCI in 2003 b. 2008-RCA was occluded; residual CAD- 70% CFX and 50% LAD with normal LVF c. low risk Myoview in Dec 2011 2.  Diabetes 3.  Hypertension 4.  Dyslipidemia 5.  GERD 6.  Osteoarthritis   No Known Allergies   Procedures This Admission:  1.  Implantation of a MDT dual chamber ICD on 02-16-2014 by Dr Lovena Le.  The patient received a MDT model number Evera ICD with model number 5076 right atrial lead and 4166 right ventricular lead.  DFT's were successful at 55 J.  There were no immediate post procedure complications. 2.  CXR on 12/3 demonstrated no pneumothorax status post device implantation.   Brief HPI: Trevor [name] is a 73 y.o. male with prior VF arrest, CAD, and ischemic cardiomyopathy.  He was referred for consideration of ICD implant.  Risks, benefits, and alternatives to ICD implantation were reviewed with the patient who wished to proceed.   Hospital Course:  The patient was admitted and underwent implantation of a MDT dual chamber ICD with details as outlined above.   He was monitored on telemetry overnight which demonstrated nsr.  Left chest was without hematoma or ecchymosis.  The device was interrogated and found to be functioning normally.  CXR was obtained and demonstrated no pneumothorax status post device implantation.  Wound care, arm mobility, and restrictions were reviewed with the patient.  Dr Lovena Le examined the patient and considered them stable for discharge to home.   The patient's discharge medications include an ACE-I (Lisinopril) and beta blocker  (Metoprolol).   Discharge Vitals: Blood pressure 135/66, pulse 68, temperature 97.6 F (36.4 C), temperature source Oral, resp. rate 11, height 6' (1.829 m), weight 245 lb (111.131 kg), SpO2 99 %.    Labs:   Lab Results  Component Value Date   WBC 7.3 02/14/2014   HGB 14.4 02/14/2014   HCT 43.7 02/14/2014   MCV 88.6 02/14/2014   PLT 205 02/14/2014    Recent Labs Lab 02/14/14 0940  NA 135  K 5.2  CL 98  CO2 28  BUN 13  CREATININE 1.02  CALCIUM 9.6  GLUCOSE 217*     Discharge Medications:    Medication List    ASK your doctor about these medications        aspirin 81 MG tablet  Take 81 mg by mouth daily.     clopidogrel 75 MG tablet  Commonly known as:  PLAVIX  Take 75 mg by mouth daily.     esomeprazole 40 MG capsule  Commonly known as:  NEXIUM  Take 40 mg by mouth daily at 12 noon.     glucosamine-chondroitin 500-400 MG tablet  Take 1 tablet by mouth 3 (three) times daily.     hydrochlorothiazide 25 MG tablet  Commonly known as:  HYDRODIURIL  Take 25 mg by mouth daily.     insulin glargine 100 UNIT/ML injection  Commonly known as:  LANTUS  Inject 100 Units into the skin at bedtime. Wife states its 100 units per wife     lisinopril 40 MG tablet  Commonly known as:  PRINIVIL,ZESTRIL  Take 40 mg  by mouth daily.     metFORMIN 1000 MG tablet  Commonly known as:  GLUCOPHAGE  Take 1,000 mg by mouth 2 (two) times daily with a meal.     metoprolol tartrate 25 MG tablet  Commonly known as:  LOPRESSOR  Take 1 tablet (25 mg total) by mouth 2 (two) times daily.     rosuvastatin 10 MG tablet  Commonly known as:  CRESTOR  Take 10 mg by mouth daily.        Disposition:     Duration of Discharge Encounter: less than 30 minutes including physician time.  Signed,  Mikle Bosworth.D.

## 2014-02-17 ENCOUNTER — Ambulatory Visit (HOSPITAL_COMMUNITY): Payer: PRIVATE HEALTH INSURANCE

## 2014-02-17 ENCOUNTER — Encounter (HOSPITAL_COMMUNITY): Payer: Self-pay | Admitting: *Deleted

## 2014-02-17 DIAGNOSIS — I462 Cardiac arrest due to underlying cardiac condition: Secondary | ICD-10-CM | POA: Diagnosis not present

## 2014-02-17 LAB — GLUCOSE, CAPILLARY: Glucose-Capillary: 203 mg/dL — ABNORMAL HIGH (ref 70–99)

## 2014-02-17 NOTE — Discharge Instructions (Signed)
NO DRIVING FOR 6 MONTHS   Cardioverter Defibrillator Implantation, Care After Refer to this sheet in the next few weeks. These instructions provide you with information on caring for yourself after your procedure. Your health care provider may also give you more specific instructions. Your treatment has been planned according to current medical practices, but problems sometimes occur. Call your health care provider if you have any problems or questions after your procedure.  WHAT TO EXPECT AFTER THE PROCEDURE  You may feel pain. Some pain is normal. It may last a few days.  A slight bump may be seen over the skin where the device was placed. Sometimes, it is possible to feel the device under the skin. This is normal.  In the months and years afterward, your health care provider will check the device, the leads, and the battery every few months. Eventually, when the battery is low, the device will be replaced. HOME CARE INSTRUCTIONS  Medicines  Take medicines only as directed by your health care provider.  If you were prescribed an antibiotic medicine, finish it all even if you start to feel better.   Do not take any other medicines without asking your health care provider first. Some medicines, including certain painkillers, can cause bleeding after surgery.  Wound Care  Do not remove the bandage on your chest until directed to do so by your health care provider.  Once your bandage is removed, you may see pieces of tape called skin adhesive strips over the area where the cut was made (incision site). Let them fall off on their own.   Check the incision site every day to make sure it is not infected, bleeding, or starting to pull apart.  Do not use lotions or ointments near the incision site unless directed to do so.   Keep the incision area clean and dry for 2-3 days after the procedure or as directed by your health care provider. It takes several weeks for the incision site to  completely heal.   Do not take baths, swim, or use a hot tub until your health care provider approves. Activities  Try to walk a little every day. Exercising is important after this procedure. It is also important to use your shoulder on the side of the pacemaker in daily tasks that do not require exaggerated motion.  Avoid sudden jerking, pulling, or chopping movements that pull your upper arm far away from your body for at least 6 weeks.  Do not lift your upper arm above your shoulders for at least 6 weeks. This means no tennis, golf, or swimming for this period of time. If you sleep with the arm above your head, use a restraint to prevent this from happening as you sleep.  You may go back to work when your health care provider says it is okay. Check with your health care provider before you start to drive or play sports.  Other Instructions  Follow diet instructions if they were provided. You should be able to eat what you usually do right away, but you may need to limit your salt intake.   Weigh yourself every day. If you suddenly gain weight, fluid may be building up in your body.   Always carry your pacemaker identification card with you. The card should list the implant date, device model, and manufacturer. Consider wearing a medical alert bracelet or necklace.  Tell all health care providers that you have a pacemaker. This may prevent them from giving you a  magnetic resonance imaging (MRI) scan because of the strong magnets used during that test.  If you must pass through a metal detector, quickly walk through it. Do not stop under the detector or stand near it.  Avoid places or objects with a strong electric or magnetic field, including:   Engineer, maintenance. When at the airport, let officials know you have a pacemaker. Your ID card will let you be checked in a way that is safe for you and that will not damage your pacemaker. Also, do not let a security person wave a  magnetic wand near your pacemaker. That can make it stop working.  Power plants.   Large electrical generators.   Radiofrequency transmission towers, such as cell phone and radio towers.   Do not use amateur (ham) radio equipment or electric (arc) welding torches. Some devices are safe to use if held at least 1 foot from your pacemaker. These include power tools, lawn mowers, and speakers. If you are unsure of whether something is safe to use, ask your health care provider.   You may safely use electric blankets, heating pads, computers, and microwave ovens.   When using your cell phone, hold it to the ear opposite the pacemaker. Do not leave your cell phone in a pocket over the pacemaker.   Keep all follow-up visits as directed by your health care provider. This is how your health care provider makes sure your chest is healing the way it should. Ask your health care provider when you should come back to have your stitches or staples taken out.   Have your pacemaker checked every 3-6 months or as directed by your health care provider. Most pacemakers last for 4-8 years before a new one is needed. SEEK MEDICAL CARE IF:   You feel one shock in your chest.  You gain weight suddenly.   Your legs or feet swell more than they have before.   It feels like your heart is fluttering or skipping beats (heart palpitations).  You have a fever. SEEK IMMEDIATE MEDICAL CARE IF:   You have chest pain.  You feel more than one shock.  You feel more short of breath than you have felt before.  You feel more light-headed than you have felt before.  You have problems with your incision site, such as swelling or bleeding, or it starts to open up.   You notice signs of infection around your incision site. Watch for:   Warmth.   Redness.   Worsening pain.   Swelling.   Fluid leaking from the incision site.  Document Released: 09/21/2004 Document Revised: 07/19/2013  Document Reviewed: 03/25/2012 [facility] Patient Information 2015 [place], [place]. This information is not intended to replace advice given to you by your health care provider. Make sure you discuss any questions you have with your health care provider.

## 2014-02-24 ENCOUNTER — Encounter (HOSPITAL_COMMUNITY): Payer: Self-pay | Admitting: Cardiovascular Disease

## 2014-03-02 ENCOUNTER — Encounter: Payer: Self-pay | Admitting: Internal Medicine

## 2014-03-02 ENCOUNTER — Ambulatory Visit (INDEPENDENT_AMBULATORY_CARE_PROVIDER_SITE_OTHER): Payer: PRIVATE HEALTH INSURANCE | Admitting: *Deleted

## 2014-03-02 ENCOUNTER — Telehealth: Payer: Self-pay | Admitting: *Deleted

## 2014-03-02 DIAGNOSIS — I469 Cardiac arrest, cause unspecified: Secondary | ICD-10-CM

## 2014-03-02 LAB — MDC_IDC_ENUM_SESS_TYPE_INCLINIC
Brady Statistic AP VP Percent: 0 %
Brady Statistic AP VS Percent: 0.52 %
Brady Statistic AS VP Percent: 0.04 %
Brady Statistic RA Percent Paced: 0.52 %
Brady Statistic RV Percent Paced: 0.04 %
HIGH POWER IMPEDANCE MEASURED VALUE: 133 Ohm
HIGH POWER IMPEDANCE MEASURED VALUE: 68 Ohm
Lead Channel Impedance Value: 437 Ohm
Lead Channel Impedance Value: 513 Ohm
Lead Channel Pacing Threshold Amplitude: 0.625 V
Lead Channel Pacing Threshold Amplitude: 0.75 V
Lead Channel Pacing Threshold Pulse Width: 0.4 ms
Lead Channel Sensing Intrinsic Amplitude: 11.25 mV
Lead Channel Sensing Intrinsic Amplitude: 11.875 mV
Lead Channel Setting Pacing Amplitude: 3.5 V
Lead Channel Setting Pacing Pulse Width: 0.4 ms
Lead Channel Setting Sensing Sensitivity: 0.3 mV
MDC IDC MSMT BATTERY REMAINING LONGEVITY: 133 mo
MDC IDC MSMT BATTERY VOLTAGE: 3.04 V
MDC IDC MSMT LEADCHNL RA SENSING INTR AMPL: 3.125 mV
MDC IDC MSMT LEADCHNL RA SENSING INTR AMPL: 3.5 mV
MDC IDC MSMT LEADCHNL RV PACING THRESHOLD PULSEWIDTH: 0.4 ms
MDC IDC SESS DTM: 20151216111932
MDC IDC SET LEADCHNL RV PACING AMPLITUDE: 3.5 V
MDC IDC SET ZONE DETECTION INTERVAL: 280 ms
MDC IDC SET ZONE DETECTION INTERVAL: 350 ms
MDC IDC STAT BRADY AS VS PERCENT: 99.44 %
Zone Setting Detection Interval: 250 ms
Zone Setting Detection Interval: 330 ms
Zone Setting Detection Interval: 450 ms

## 2014-03-02 NOTE — Progress Notes (Signed)
Wound check appointment. Steri-strips removed. Wound without redness or edema. Incision edges approximated, wound well healed. Normal device function. Thresholds, sensing, and impedances consistent with implant measurements. Device programmed at 3.5V for extra safety margin until 3 month visit. Histogram distribution appropriate for patient and level of activity. No mode switches.  1 VF episode successfully converted with 30.2j schock. Patient educated about wound care, arm mobility, lifting restrictions, shock plan. ROV in 3 months with implanting physician.

## 2014-03-02 NOTE — Telephone Encounter (Signed)
Attempted to call home number, no answer or voicemail. Called mobile number.  Pt does not recall event. Pt was watching TV. Does not recall palps, SOB, or chest pain.  PT does recall what he thought was his dog jumping on him while in his chair. When I spoke w/ pt, he was already here in [facility] parking lot for his wound check today.

## 2014-05-20 ENCOUNTER — Ambulatory Visit (INDEPENDENT_AMBULATORY_CARE_PROVIDER_SITE_OTHER): Payer: PRIVATE HEALTH INSURANCE | Admitting: Internal Medicine

## 2014-05-20 ENCOUNTER — Encounter: Payer: Self-pay | Admitting: Internal Medicine

## 2014-05-20 VITALS — BP 138/70 | HR 59 | Ht 72.0 in | Wt 251.4 lb

## 2014-05-20 DIAGNOSIS — Z95810 Presence of automatic (implantable) cardiac defibrillator: Secondary | ICD-10-CM | POA: Insufficient documentation

## 2014-05-20 DIAGNOSIS — I469 Cardiac arrest, cause unspecified: Secondary | ICD-10-CM | POA: Diagnosis not present

## 2014-05-20 DIAGNOSIS — I4901 Ventricular fibrillation: Secondary | ICD-10-CM | POA: Diagnosis not present

## 2014-05-20 DIAGNOSIS — I5022 Chronic systolic (congestive) heart failure: Secondary | ICD-10-CM

## 2014-05-20 DIAGNOSIS — I10 Essential (primary) hypertension: Secondary | ICD-10-CM

## 2014-05-20 LAB — MDC_IDC_ENUM_SESS_TYPE_INCLINIC
Battery Remaining Longevity: 131 mo
Battery Voltage: 3.1 V
Brady Statistic AP VP Percent: 0.01 %
Brady Statistic RA Percent Paced: 8.74 %
HIGH POWER IMPEDANCE MEASURED VALUE: 75 Ohm
HighPow Impedance: 171 Ohm
Lead Channel Impedance Value: 456 Ohm
Lead Channel Impedance Value: 513 Ohm
Lead Channel Pacing Threshold Pulse Width: 0.4 ms
Lead Channel Sensing Intrinsic Amplitude: 13.5 mV
Lead Channel Sensing Intrinsic Amplitude: 3.375 mV
Lead Channel Setting Pacing Amplitude: 1.75 V
Lead Channel Setting Pacing Amplitude: 2 V
Lead Channel Setting Pacing Pulse Width: 0.4 ms
Lead Channel Setting Sensing Sensitivity: 0.3 mV
MDC IDC MSMT LEADCHNL RA PACING THRESHOLD AMPLITUDE: 1 V
MDC IDC MSMT LEADCHNL RA SENSING INTR AMPL: 2.875 mV
MDC IDC MSMT LEADCHNL RV PACING THRESHOLD AMPLITUDE: 0.75 V
MDC IDC MSMT LEADCHNL RV PACING THRESHOLD PULSEWIDTH: 0.4 ms
MDC IDC MSMT LEADCHNL RV SENSING INTR AMPL: 13.625 mV
MDC IDC SESS DTM: 20160304095847
MDC IDC SET ZONE DETECTION INTERVAL: 250 ms
MDC IDC SET ZONE DETECTION INTERVAL: 280 ms
MDC IDC STAT BRADY AP VS PERCENT: 8.73 %
MDC IDC STAT BRADY AS VP PERCENT: 0.04 %
MDC IDC STAT BRADY AS VS PERCENT: 91.22 %
MDC IDC STAT BRADY RV PERCENT PACED: 0.05 %
Zone Setting Detection Interval: 330 ms
Zone Setting Detection Interval: 350 ms
Zone Setting Detection Interval: 450 ms

## 2014-05-20 LAB — PACEMAKER DEVICE OBSERVATION

## 2014-05-20 NOTE — Assessment & Plan Note (Signed)
His blood pressure is well controlled. He will continue his current meds and maintain a low sodium diet. 

## 2014-05-20 NOTE — Assessment & Plan Note (Signed)
He has had no recurrent ventricular arrhythmias. Will follow. No change in meds.

## 2014-05-20 NOTE — Patient Instructions (Addendum)
Your physician recommends that you schedule a follow-up appointment in 9 Months with Dr. Lovena Le.  Remote monitoring is used to monitor your Pacemaker of ICD from home. This monitoring reduces the number of office visits required to check your device to one time per year. It allows [place] to keep an eye on the functioning of your device to ensure it is working properly. You are scheduled for a device check from home on June 6. You may send your transmission at any time that day. If you have a wireless device, the transmission will be sent automatically. After your physician reviews your transmission, you will receive a postcard with your next transmission date.   Your physician recommends that you continue on your current medications as directed. Please refer to the Current Medication list given to you today.  Thank you for choosing [facility]!

## 2014-05-20 NOTE — Assessment & Plan Note (Signed)
His Medtronic ICD is working normally. Will recheck in several months. 

## 2014-05-20 NOTE — Progress Notes (Signed)
HPI [name] returns today for followup. He is a pleasant 74 yo man with a h/o CAD, LV dysfunction who sustained a VF arrest several months ago. He underwent catheterization and was found to have a chronically occluded RCA. He underwent ICD implant and returns today for followup. In the interim he has done well with no chest pain or sob. No syncope. No ICD shock.    . No Known Allergies   Current Outpatient Prescriptions  Medication Sig Dispense Refill  . aspirin 81 MG tablet Take 81 mg by mouth daily.    . clopidogrel (PLAVIX) 75 MG tablet Take 75 mg by mouth daily.    Marland Kitchen esomeprazole (NEXIUM) 40 MG capsule Take 40 mg by mouth daily at 12 noon.    Marland Kitchen glucosamine-chondroitin 500-400 MG tablet Take 1 tablet by mouth 3 (three) times daily.    . hydrochlorothiazide (HYDRODIURIL) 25 MG tablet Take 25 mg by mouth daily.    . insulin glargine (LANTUS) 100 UNIT/ML injection Inject 100 Units into the skin at bedtime. Wife states its 100 units per wife    . lisinopril (PRINIVIL,ZESTRIL) 40 MG tablet Take 40 mg by mouth daily.    . metFORMIN (GLUCOPHAGE) 1000 MG tablet Take 1,000 mg by mouth 2 (two) times daily with a meal.    . metoprolol tartrate (LOPRESSOR) 25 MG tablet Take 1 tablet (25 mg total) by mouth 2 (two) times daily. 60 tablet 5  . rosuvastatin (CRESTOR) 10 MG tablet Take 10 mg by mouth daily.     No current facility-administered medications for this visit.     Past Medical History  Diagnosis Date  . CAD (coronary artery disease)     a. RCA PCI in 2003 b. 2008-RCA was occluded; residual CAD- 70% CFX and 50% LAD with normal LVF c. low risk Myoview in Dec 2011  . Diabetes mellitus   . HTN (hypertension)   . Dyslipidemia   . GERD (gastroesophageal reflux disease)   . Osteoarthritis   . Ischemic cardiomyopathy     a. s/p MDT dual chamber ICD 02-2014    ROS:   All systems reviewed and negative except as noted in the HPI.   Past Surgical History  Procedure Laterality  Date  . Coronary angioplasty  2003    RCA PCI  . Cardiac catheterization  2008    occluded RCA- med Rx  . Cataract extraction Right July 2011  . Cervical spine surgery  Feb 2010  . Implantable cardioverter defibrillator implant  02-16-2014    MDT Evera dual chamber ICD implanted by Dr Lovena Le  . Left heart catheterization with coronary angiogram N/A 01/05/2014    Procedure: LEFT HEART CATHETERIZATION WITH CORONARY ANGIOGRAM;  Surgeon: Wellington Hampshire, MD;  Location: [facility] CATH LAB;  Service: Cardiovascular;  Laterality: N/A;  . Implantable cardioverter defibrillator implant N/A 02/16/2014    Procedure: IMPLANTABLE CARDIOVERTER DEFIBRILLATOR IMPLANT;  Surgeon: Evans Lance, MD;  Location: [facility] CATH LAB;  Service: Cardiovascular;  Laterality: N/A;     Family History  Problem Relation Age of Onset  . Diabetes       History   Social History  . Marital Status: Married    Spouse Name: N/A  . Number of Children: N/A  . Years of Education: N/A   Occupational History  . Not on file.   Social History Main Topics  . Smoking status: Former Research scientist (life sciences)  . Smokeless tobacco: Former Systems developer    Quit date: 01/04/1983  .  Alcohol Use: Not on file  . Drug Use: Not on file  . Sexual Activity: Not on file   Other Topics Concern  . Not on file   Social History Narrative     BP 138/70 mmHg  Pulse 59  Ht 6' (1.829 m)  Wt 251 lb 6.4 oz (114.034 kg)  BMI 34.09 kg/m2  Physical Exam:  Well appearing 74 yo man, NAD HEENT: Unremarkable Neck:  No JVD, no thyromegally Back:  No CVA tenderness Lungs:  Clear with no wheezes, well healed ICD incision. HEART:  Regular rate rhythm, no murmurs, no rubs, no clicks Abd:  soft, positive bowel sounds, no organomegally, no rebound, no guarding Ext:  2 plus pulses, no edema, no cyanosis, no clubbing Skin:  No rashes no nodules Neuro:  CN II through XII intact, motor grossly intact   Assess/Plan:

## 2014-05-23 ENCOUNTER — Encounter: Payer: Self-pay | Admitting: Internal Medicine

## 2014-06-27 ENCOUNTER — Other Ambulatory Visit: Payer: Self-pay | Admitting: Cardiology

## 2014-08-22 ENCOUNTER — Encounter: Payer: PRIVATE HEALTH INSURANCE | Admitting: *Deleted

## 2014-08-29 ENCOUNTER — Ambulatory Visit (INDEPENDENT_AMBULATORY_CARE_PROVIDER_SITE_OTHER): Payer: PRIVATE HEALTH INSURANCE | Admitting: *Deleted

## 2014-08-29 DIAGNOSIS — I5022 Chronic systolic (congestive) heart failure: Secondary | ICD-10-CM | POA: Diagnosis not present

## 2014-08-29 DIAGNOSIS — I4901 Ventricular fibrillation: Secondary | ICD-10-CM

## 2014-08-29 NOTE — Progress Notes (Signed)
Remote ICD transmission.   

## 2014-09-02 LAB — CUP PACEART REMOTE DEVICE CHECK
Battery Voltage: 3.05 V
Brady Statistic AP VS Percent: 6.45 %
Date Time Interrogation Session: 20160613122134
HighPow Impedance: 78 Ohm
Lead Channel Impedance Value: 437 Ohm
Lead Channel Impedance Value: 513 Ohm
Lead Channel Pacing Threshold Amplitude: 0.75 V
Lead Channel Sensing Intrinsic Amplitude: 13 mV
Lead Channel Sensing Intrinsic Amplitude: 13 mV
Lead Channel Sensing Intrinsic Amplitude: 3 mV
Lead Channel Setting Pacing Amplitude: 2 V
Lead Channel Setting Pacing Pulse Width: 0.4 ms
Lead Channel Setting Sensing Sensitivity: 0.3 mV
MDC IDC MSMT BATTERY REMAINING LONGEVITY: 129 mo
MDC IDC MSMT LEADCHNL RA PACING THRESHOLD AMPLITUDE: 0.875 V
MDC IDC MSMT LEADCHNL RA PACING THRESHOLD PULSEWIDTH: 0.4 ms
MDC IDC MSMT LEADCHNL RA SENSING INTR AMPL: 3 mV
MDC IDC MSMT LEADCHNL RV IMPEDANCE VALUE: 380 Ohm
MDC IDC MSMT LEADCHNL RV PACING THRESHOLD PULSEWIDTH: 0.4 ms
MDC IDC SET LEADCHNL RA PACING AMPLITUDE: 2 V
MDC IDC SET ZONE DETECTION INTERVAL: 250 ms
MDC IDC SET ZONE DETECTION INTERVAL: 350 ms
MDC IDC STAT BRADY AP VP PERCENT: 0.01 %
MDC IDC STAT BRADY AS VP PERCENT: 0.03 %
MDC IDC STAT BRADY AS VS PERCENT: 93.5 %
MDC IDC STAT BRADY RA PERCENT PACED: 6.46 %
MDC IDC STAT BRADY RV PERCENT PACED: 0.04 %
Zone Setting Detection Interval: 280 ms
Zone Setting Detection Interval: 330 ms
Zone Setting Detection Interval: 450 ms

## 2014-09-07 ENCOUNTER — Encounter: Payer: Self-pay | Admitting: Cardiology

## 2014-09-09 ENCOUNTER — Encounter: Payer: Self-pay | Admitting: Internal Medicine

## 2014-09-12 ENCOUNTER — Other Ambulatory Visit: Payer: Self-pay

## 2014-09-21 ENCOUNTER — Encounter: Payer: Self-pay | Admitting: Cardiology

## 2014-09-26 DIAGNOSIS — H35351 Cystoid macular degeneration, right eye: Secondary | ICD-10-CM | POA: Diagnosis not present

## 2014-09-26 DIAGNOSIS — H35371 Puckering of macula, right eye: Secondary | ICD-10-CM | POA: Diagnosis not present

## 2014-09-26 DIAGNOSIS — E119 Type 2 diabetes mellitus without complications: Secondary | ICD-10-CM | POA: Diagnosis not present

## 2014-11-08 DIAGNOSIS — H35371 Puckering of macula, right eye: Secondary | ICD-10-CM | POA: Diagnosis not present

## 2014-11-08 DIAGNOSIS — H35351 Cystoid macular degeneration, right eye: Secondary | ICD-10-CM | POA: Diagnosis not present

## 2014-11-09 ENCOUNTER — Telehealth: Payer: Self-pay | Admitting: Internal Medicine

## 2014-11-09 NOTE — Telephone Encounter (Signed)
New message      Request for surgical clearance:  What type of surgery is being performed?  Vitrectomy and membrane peel on rt eye 1. When is this surgery scheduled? 11-15-14  Are there any medications that need to be held prior to surgery and how long?stop plavix on Friday and need medical clearance 2. Name of physician performing surgery?  Dr Zadie Rhine  3. What is your office phone and fax number?  [place]

## 2014-11-10 ENCOUNTER — Telehealth: Payer: Self-pay | Admitting: Internal Medicine

## 2014-11-10 NOTE — Telephone Encounter (Signed)
Follow up     Calling to check status of surgical clearance

## 2014-11-10 NOTE — Telephone Encounter (Signed)
Office is closed.  Tried to call and leave a message.  I have sent this to Dr Lovena Le.  Will have to follow up next week when he is in the office.  This is to be done on Tues morning at 7am.  They have instructed him to stop his Plavix tomorrow.

## 2014-11-10 NOTE — Telephone Encounter (Signed)
    Status: Signed       Expand All Collapse All   Follow up     Calling to check status of surgical clearance            Trevor [name] at 11/09/2014 8:28 AM     Status: Signed       Expand All Collapse All   New message      Request for surgical clearance:  What type of surgery is being performed? Vitrectomy and membrane peel on rt eye 1. When is this surgery scheduled? 11-15-14  Are there any medications that need to be held prior to surgery and how long?stop plavix on Friday and need medical clearance 2. Name of physician performing surgery? Dr Zadie Rhine  3. What is your office phone and fax number? [place]

## 2014-11-15 DIAGNOSIS — H35371 Puckering of macula, right eye: Secondary | ICD-10-CM | POA: Diagnosis not present

## 2014-11-15 DIAGNOSIS — H5461 Unqualified visual loss, right eye, normal vision left eye: Secondary | ICD-10-CM | POA: Diagnosis not present

## 2014-11-15 NOTE — Telephone Encounter (Signed)
Dr Lovena Le cleared and okayed the stopping of Plavix.  All information faxed

## 2014-11-23 DIAGNOSIS — Z09 Encounter for follow-up examination after completed treatment for conditions other than malignant neoplasm: Secondary | ICD-10-CM | POA: Diagnosis not present

## 2014-11-23 DIAGNOSIS — H35371 Puckering of macula, right eye: Secondary | ICD-10-CM | POA: Diagnosis not present

## 2014-11-28 ENCOUNTER — Ambulatory Visit (INDEPENDENT_AMBULATORY_CARE_PROVIDER_SITE_OTHER): Payer: Medicare Other | Admitting: *Deleted

## 2014-11-28 ENCOUNTER — Telehealth: Payer: Self-pay | Admitting: Cardiology

## 2014-11-28 DIAGNOSIS — I5022 Chronic systolic (congestive) heart failure: Secondary | ICD-10-CM | POA: Diagnosis not present

## 2014-11-28 DIAGNOSIS — I4901 Ventricular fibrillation: Secondary | ICD-10-CM | POA: Diagnosis not present

## 2014-11-28 NOTE — Progress Notes (Signed)
Remote ICD transmission.   

## 2014-11-28 NOTE — Telephone Encounter (Signed)
Confirmed remote transmission w/ pt wife.   

## 2014-12-02 ENCOUNTER — Other Ambulatory Visit: Payer: Self-pay | Admitting: Internal Medicine

## 2014-12-09 LAB — CUP PACEART REMOTE DEVICE CHECK
Battery Remaining Longevity: 128 mo
Brady Statistic AP VS Percent: 1.55 %
Brady Statistic AS VS Percent: 98.41 %
Brady Statistic RA Percent Paced: 1.56 %
Brady Statistic RV Percent Paced: 0.04 %
HIGH POWER IMPEDANCE MEASURED VALUE: 84 Ohm
Lead Channel Impedance Value: 437 Ohm
Lead Channel Impedance Value: 456 Ohm
Lead Channel Impedance Value: 532 Ohm
Lead Channel Pacing Threshold Amplitude: 0.625 V
Lead Channel Pacing Threshold Amplitude: 0.875 V
Lead Channel Pacing Threshold Pulse Width: 0.4 ms
Lead Channel Sensing Intrinsic Amplitude: 3.125 mV
Lead Channel Setting Pacing Pulse Width: 0.4 ms
MDC IDC MSMT BATTERY VOLTAGE: 3.02 V
MDC IDC MSMT LEADCHNL RA PACING THRESHOLD PULSEWIDTH: 0.4 ms
MDC IDC MSMT LEADCHNL RA SENSING INTR AMPL: 3.125 mV
MDC IDC MSMT LEADCHNL RV SENSING INTR AMPL: 13.625 mV
MDC IDC MSMT LEADCHNL RV SENSING INTR AMPL: 13.625 mV
MDC IDC SESS DTM: 20160912191701
MDC IDC SET LEADCHNL RA PACING AMPLITUDE: 1.75 V
MDC IDC SET LEADCHNL RV PACING AMPLITUDE: 2 V
MDC IDC SET LEADCHNL RV SENSING SENSITIVITY: 0.3 mV
MDC IDC SET ZONE DETECTION INTERVAL: 280 ms
MDC IDC SET ZONE DETECTION INTERVAL: 330 ms
MDC IDC STAT BRADY AP VP PERCENT: 0.01 %
MDC IDC STAT BRADY AS VP PERCENT: 0.03 %
Zone Setting Detection Interval: 250 ms
Zone Setting Detection Interval: 350 ms
Zone Setting Detection Interval: 450 ms

## 2014-12-23 ENCOUNTER — Encounter: Payer: Self-pay | Admitting: Cardiology

## 2015-01-03 DIAGNOSIS — R040 Epistaxis: Secondary | ICD-10-CM | POA: Diagnosis not present

## 2015-01-04 ENCOUNTER — Encounter: Payer: Self-pay | Admitting: Internal Medicine

## 2015-01-06 ENCOUNTER — Encounter: Payer: Self-pay | Admitting: Cardiology

## 2015-01-10 DIAGNOSIS — H35371 Puckering of macula, right eye: Secondary | ICD-10-CM | POA: Diagnosis not present

## 2015-01-10 DIAGNOSIS — Z09 Encounter for follow-up examination after completed treatment for conditions other than malignant neoplasm: Secondary | ICD-10-CM | POA: Diagnosis not present

## 2015-01-11 DIAGNOSIS — I10 Essential (primary) hypertension: Secondary | ICD-10-CM | POA: Diagnosis not present

## 2015-01-11 DIAGNOSIS — E782 Mixed hyperlipidemia: Secondary | ICD-10-CM | POA: Diagnosis not present

## 2015-01-11 DIAGNOSIS — E1142 Type 2 diabetes mellitus with diabetic polyneuropathy: Secondary | ICD-10-CM | POA: Diagnosis not present

## 2015-01-11 DIAGNOSIS — K210 Gastro-esophageal reflux disease with esophagitis: Secondary | ICD-10-CM | POA: Diagnosis not present

## 2015-01-11 DIAGNOSIS — M1711 Unilateral primary osteoarthritis, right knee: Secondary | ICD-10-CM | POA: Diagnosis not present

## 2015-01-18 DIAGNOSIS — M1711 Unilateral primary osteoarthritis, right knee: Secondary | ICD-10-CM | POA: Diagnosis not present

## 2015-01-18 DIAGNOSIS — E6609 Other obesity due to excess calories: Secondary | ICD-10-CM | POA: Diagnosis not present

## 2015-01-18 DIAGNOSIS — G6289 Other specified polyneuropathies: Secondary | ICD-10-CM | POA: Diagnosis not present

## 2015-01-18 DIAGNOSIS — E782 Mixed hyperlipidemia: Secondary | ICD-10-CM | POA: Diagnosis not present

## 2015-01-18 DIAGNOSIS — M545 Low back pain: Secondary | ICD-10-CM | POA: Diagnosis not present

## 2015-01-18 DIAGNOSIS — K219 Gastro-esophageal reflux disease without esophagitis: Secondary | ICD-10-CM | POA: Diagnosis not present

## 2015-01-18 DIAGNOSIS — E1142 Type 2 diabetes mellitus with diabetic polyneuropathy: Secondary | ICD-10-CM | POA: Diagnosis not present

## 2015-01-18 DIAGNOSIS — Z23 Encounter for immunization: Secondary | ICD-10-CM | POA: Diagnosis not present

## 2015-01-18 DIAGNOSIS — I25111 Atherosclerotic heart disease of native coronary artery with angina pectoris with documented spasm: Secondary | ICD-10-CM | POA: Diagnosis not present

## 2015-01-18 DIAGNOSIS — Z1389 Encounter for screening for other disorder: Secondary | ICD-10-CM | POA: Diagnosis not present

## 2015-02-28 ENCOUNTER — Encounter: Payer: Self-pay | Admitting: Internal Medicine

## 2015-03-06 DIAGNOSIS — J0101 Acute recurrent maxillary sinusitis: Secondary | ICD-10-CM | POA: Diagnosis not present

## 2015-03-14 ENCOUNTER — Encounter: Payer: Self-pay | Admitting: *Deleted

## 2015-04-17 DIAGNOSIS — M1711 Unilateral primary osteoarthritis, right knee: Secondary | ICD-10-CM | POA: Diagnosis not present

## 2015-04-17 DIAGNOSIS — E782 Mixed hyperlipidemia: Secondary | ICD-10-CM | POA: Diagnosis not present

## 2015-04-17 DIAGNOSIS — I10 Essential (primary) hypertension: Secondary | ICD-10-CM | POA: Diagnosis not present

## 2015-04-17 DIAGNOSIS — E1142 Type 2 diabetes mellitus with diabetic polyneuropathy: Secondary | ICD-10-CM | POA: Diagnosis not present

## 2015-04-17 DIAGNOSIS — K210 Gastro-esophageal reflux disease with esophagitis: Secondary | ICD-10-CM | POA: Diagnosis not present

## 2015-04-20 DIAGNOSIS — E782 Mixed hyperlipidemia: Secondary | ICD-10-CM | POA: Diagnosis not present

## 2015-04-20 DIAGNOSIS — I2510 Atherosclerotic heart disease of native coronary artery without angina pectoris: Secondary | ICD-10-CM | POA: Diagnosis not present

## 2015-04-20 DIAGNOSIS — I10 Essential (primary) hypertension: Secondary | ICD-10-CM | POA: Diagnosis not present

## 2015-04-20 DIAGNOSIS — G6289 Other specified polyneuropathies: Secondary | ICD-10-CM | POA: Diagnosis not present

## 2015-04-20 DIAGNOSIS — E1142 Type 2 diabetes mellitus with diabetic polyneuropathy: Secondary | ICD-10-CM | POA: Diagnosis not present

## 2015-04-20 DIAGNOSIS — E6609 Other obesity due to excess calories: Secondary | ICD-10-CM | POA: Diagnosis not present

## 2015-04-20 DIAGNOSIS — K219 Gastro-esophageal reflux disease without esophagitis: Secondary | ICD-10-CM | POA: Diagnosis not present

## 2015-05-01 ENCOUNTER — Encounter: Payer: Self-pay | Admitting: Internal Medicine

## 2015-05-01 ENCOUNTER — Ambulatory Visit (INDEPENDENT_AMBULATORY_CARE_PROVIDER_SITE_OTHER): Payer: PRIVATE HEALTH INSURANCE | Admitting: *Deleted

## 2015-05-01 DIAGNOSIS — I5022 Chronic systolic (congestive) heart failure: Secondary | ICD-10-CM | POA: Diagnosis not present

## 2015-05-01 DIAGNOSIS — I4901 Ventricular fibrillation: Secondary | ICD-10-CM | POA: Diagnosis not present

## 2015-05-01 LAB — CUP PACEART INCLINIC DEVICE CHECK
Battery Remaining Longevity: 122 mo
Date Time Interrogation Session: 20170213135120
HIGH POWER IMPEDANCE MEASURED VALUE: 84 Ohm
Implantable Lead Implant Date: 20151202
Lead Channel Impedance Value: 456 Ohm
Lead Channel Pacing Threshold Amplitude: 0.75 V
Lead Channel Pacing Threshold Pulse Width: 0.4 ms
Lead Channel Setting Pacing Amplitude: 1.75 V
Lead Channel Setting Pacing Amplitude: 2 V
Lead Channel Setting Pacing Pulse Width: 0.4 ms
Lead Channel Setting Sensing Sensitivity: 0.3 mV
MDC IDC LEAD LOCATION: 753860
MDC IDC LEAD MODEL: 6935
MDC IDC MSMT LEADCHNL RA IMPEDANCE VALUE: 570 Ohm
MDC IDC MSMT LEADCHNL RA PACING THRESHOLD AMPLITUDE: 0.75 V
MDC IDC MSMT LEADCHNL RA PACING THRESHOLD PULSEWIDTH: 0.4 ms
MDC IDC MSMT LEADCHNL RA SENSING INTR AMPL: 2.9 mV
MDC IDC MSMT LEADCHNL RV SENSING INTR AMPL: 12 mV
MDC IDC STAT BRADY AP VP PERCENT: 0.1 % — AB
MDC IDC STAT BRADY AP VS PERCENT: 4.3 %
MDC IDC STAT BRADY AS VP PERCENT: 0.1 % — AB
MDC IDC STAT BRADY AS VS PERCENT: 95.6 %

## 2015-05-01 NOTE — Progress Notes (Signed)
ICD check in clinic. Normal device function. Thresholds and sensing consistent with previous device measurements. Impedance trends stable over time. No evidence of any ventricular arrhythmias. (15) mode switches (<0.1%)---max dur. 3 hrs, Max Avg A 51 + ASA/Plavix--74 yo M, h/o DM, HTN, ICH (01/03/14). Histogram distribution appropriate for patient and level of activity. Thoracic impedance is returning to baseline. No changes made this session. Device programmed at appropriate safety margins. Device programmed to optimize intrinsic conduction. Estimated longevity 10.2 years. Pt will follow up with GT/R in 3 months. Alert tones demonstrated for patient.

## 2015-05-10 DIAGNOSIS — E1142 Type 2 diabetes mellitus with diabetic polyneuropathy: Secondary | ICD-10-CM | POA: Diagnosis not present

## 2015-05-10 DIAGNOSIS — T25211A Burn of second degree of right ankle, initial encounter: Secondary | ICD-10-CM | POA: Diagnosis not present

## 2015-05-10 DIAGNOSIS — M109 Gout, unspecified: Secondary | ICD-10-CM | POA: Diagnosis not present

## 2015-05-10 DIAGNOSIS — G6289 Other specified polyneuropathies: Secondary | ICD-10-CM | POA: Diagnosis not present

## 2015-05-10 DIAGNOSIS — I10 Essential (primary) hypertension: Secondary | ICD-10-CM | POA: Diagnosis not present

## 2015-05-10 DIAGNOSIS — M25571 Pain in right ankle and joints of right foot: Secondary | ICD-10-CM | POA: Diagnosis not present

## 2015-05-15 ENCOUNTER — Other Ambulatory Visit: Payer: Self-pay | Admitting: Internal Medicine

## 2015-06-01 DIAGNOSIS — M109 Gout, unspecified: Secondary | ICD-10-CM | POA: Diagnosis not present

## 2015-06-01 DIAGNOSIS — E1142 Type 2 diabetes mellitus with diabetic polyneuropathy: Secondary | ICD-10-CM | POA: Diagnosis not present

## 2015-06-01 DIAGNOSIS — I10 Essential (primary) hypertension: Secondary | ICD-10-CM | POA: Diagnosis not present

## 2015-06-01 DIAGNOSIS — G6289 Other specified polyneuropathies: Secondary | ICD-10-CM | POA: Diagnosis not present

## 2015-07-03 DIAGNOSIS — M10321 Gout due to renal impairment, right elbow: Secondary | ICD-10-CM | POA: Diagnosis not present

## 2015-07-03 DIAGNOSIS — M25521 Pain in right elbow: Secondary | ICD-10-CM | POA: Diagnosis not present

## 2015-07-11 DIAGNOSIS — H35351 Cystoid macular degeneration, right eye: Secondary | ICD-10-CM | POA: Diagnosis not present

## 2015-07-11 DIAGNOSIS — H2512 Age-related nuclear cataract, left eye: Secondary | ICD-10-CM | POA: Diagnosis not present

## 2015-07-11 DIAGNOSIS — H35371 Puckering of macula, right eye: Secondary | ICD-10-CM | POA: Diagnosis not present

## 2015-07-11 DIAGNOSIS — E119 Type 2 diabetes mellitus without complications: Secondary | ICD-10-CM | POA: Diagnosis not present

## 2015-08-22 DIAGNOSIS — H8112 Benign paroxysmal vertigo, left ear: Secondary | ICD-10-CM | POA: Diagnosis not present

## 2015-09-15 DIAGNOSIS — C44319 Basal cell carcinoma of skin of other parts of face: Secondary | ICD-10-CM | POA: Diagnosis not present

## 2015-10-23 ENCOUNTER — Other Ambulatory Visit: Payer: Self-pay | Admitting: Internal Medicine

## 2015-10-24 IMAGING — CT CT HEAD W/O CM
1 series · 15 of 30 positions shown, 19 images · non-contrast
Comparison: 01/03/2014.

CLINICAL DATA: 73-year-old male. Subdural hematoma follow-up.
Diabetes. Hypertension. Hyperlipidemia. Subsequent encounter.

EXAM:
CT HEAD WITHOUT CONTRAST
TECHNIQUE: Contiguous axial images were obtained from the base of the skull
through the vertex without intravenous contrast.

[Series 2: head 5.0 h30s · axial · 0.46mm/px · z∈[+1476,+1631]mm · 15 of 35 slices shown, 19 images]
[im 2/35  brain]
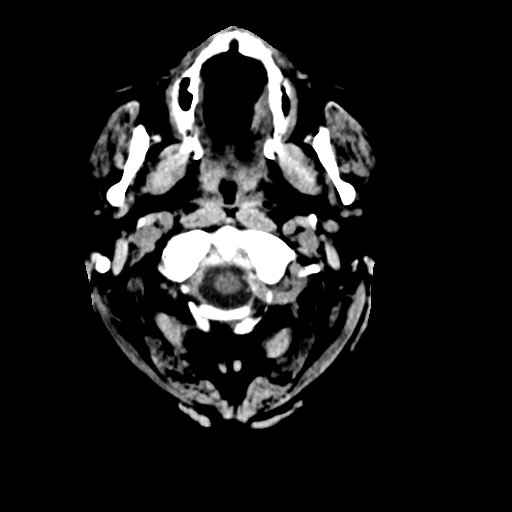
[im 2/35  bone]
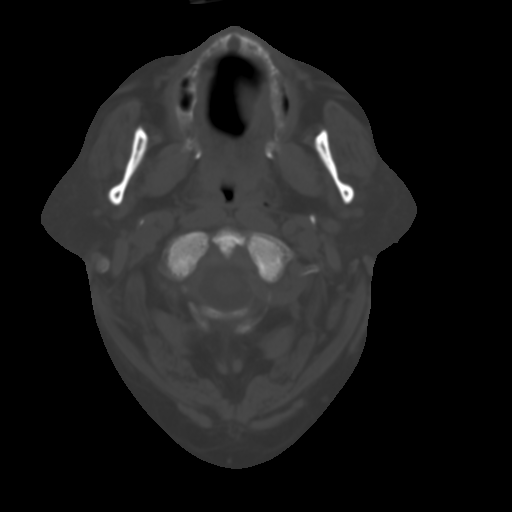
[im 4/35  brain]
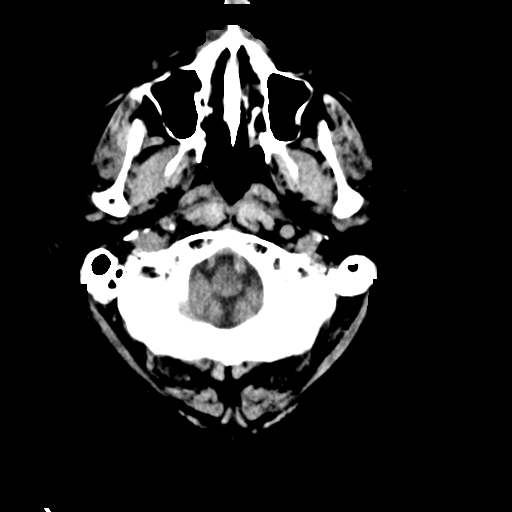
[im 6/35  brain]
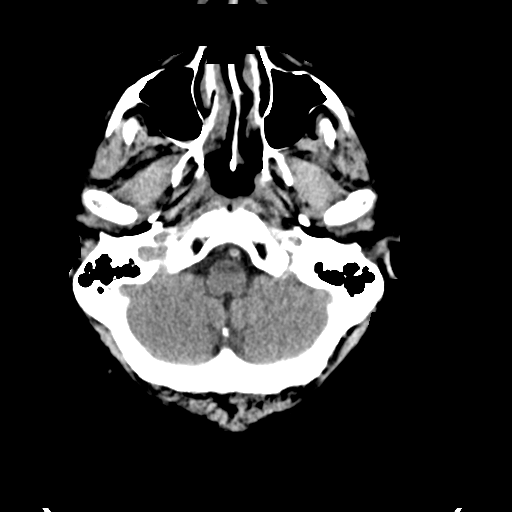
[im 9/35  brain]
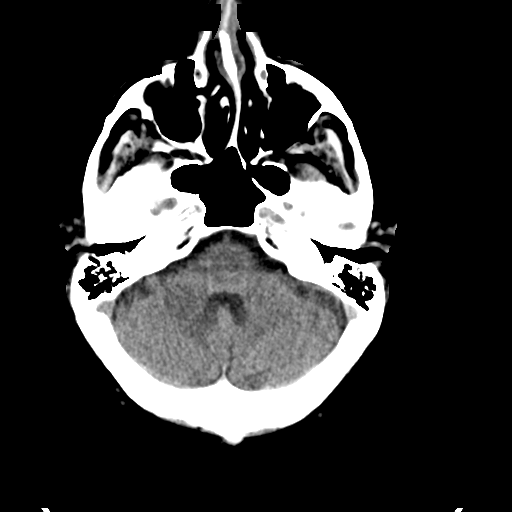
[im 11/35  brain]
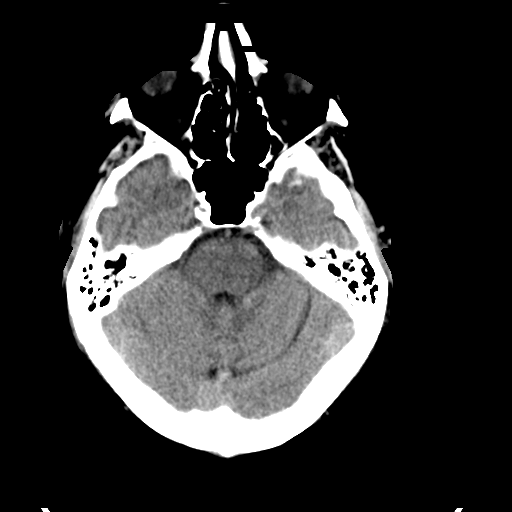
[im 11/35  bone]
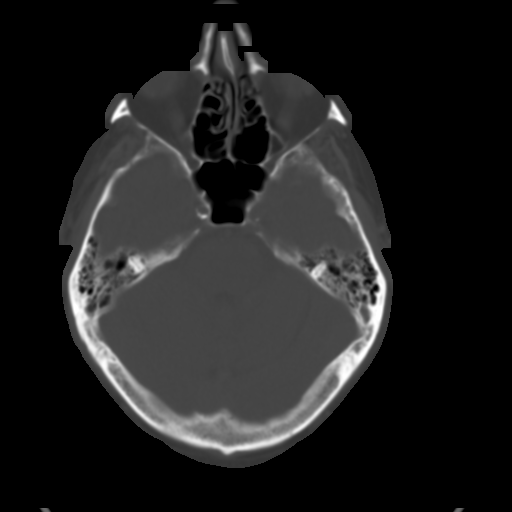
[im 13/35  brain]
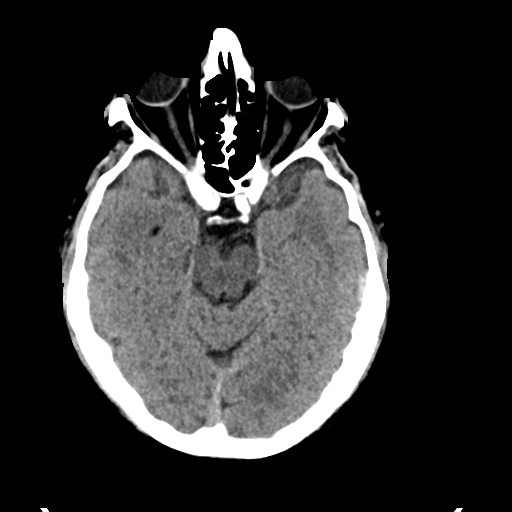
[im 16/35  brain]
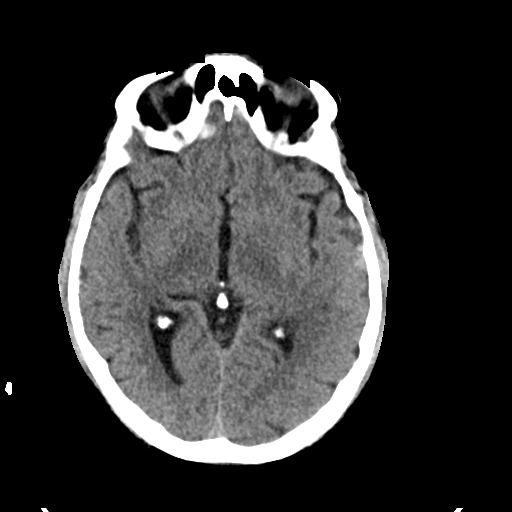
[im 18/35  brain]
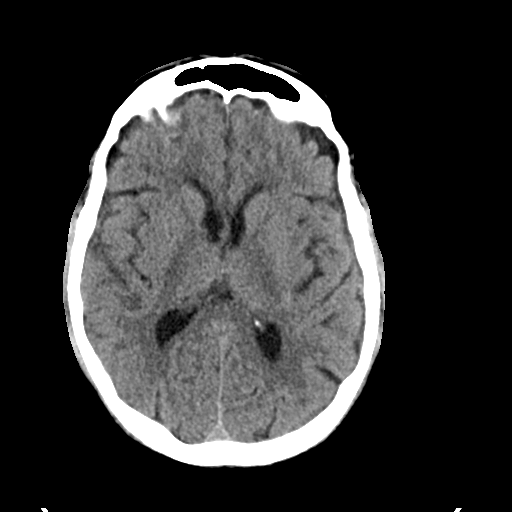
[im 19/35  brain]
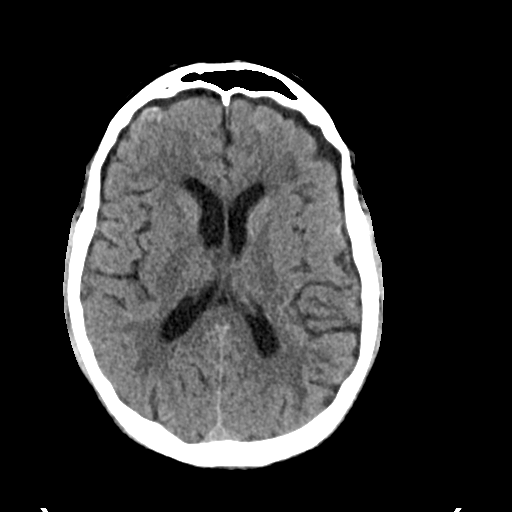
[im 19/35  bone]
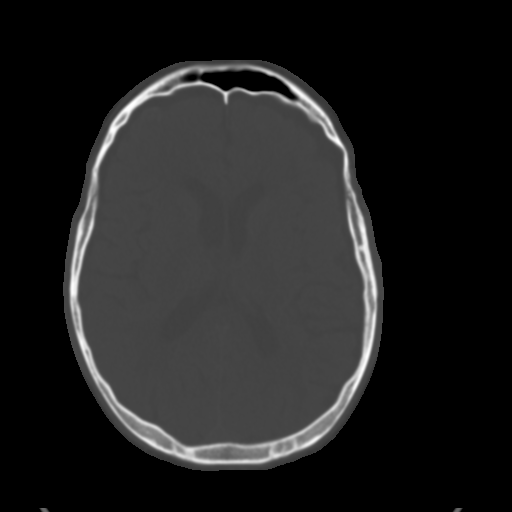
[im 22/35  brain]
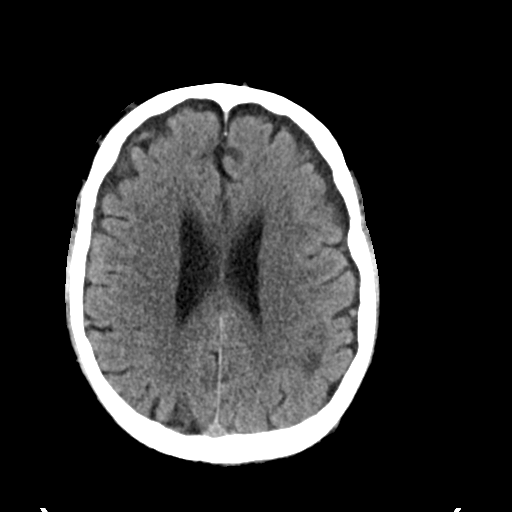
[im 24/35  brain]
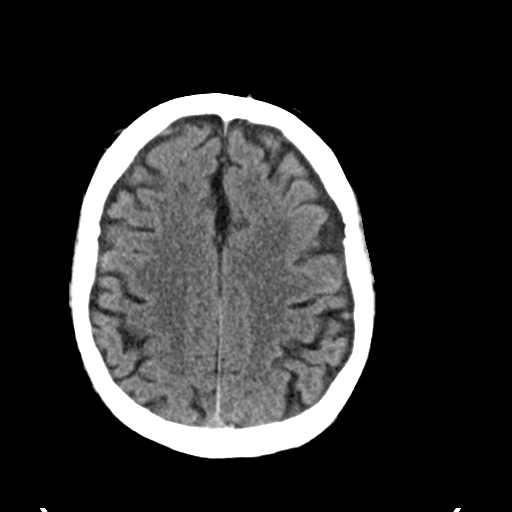
[im 26/35  brain]
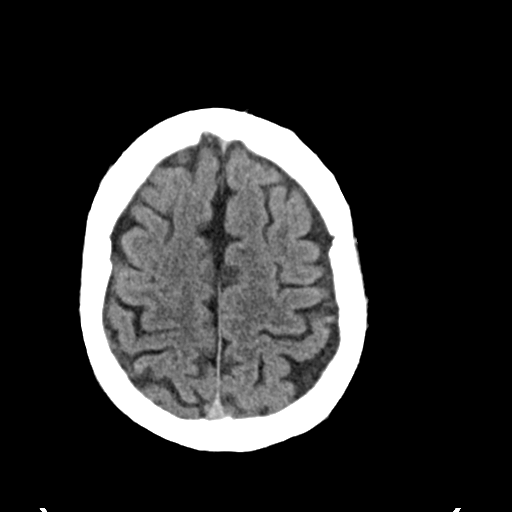
[im 29/35  brain]
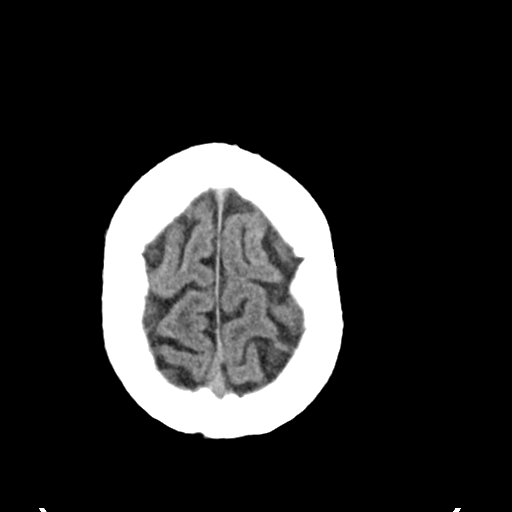
[im 29/35  bone]
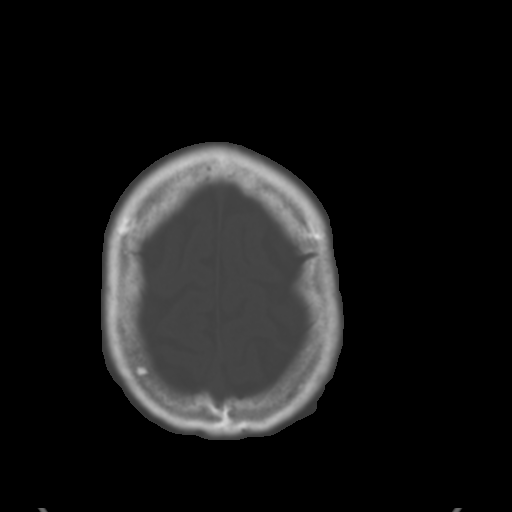
[im 31/35  brain]
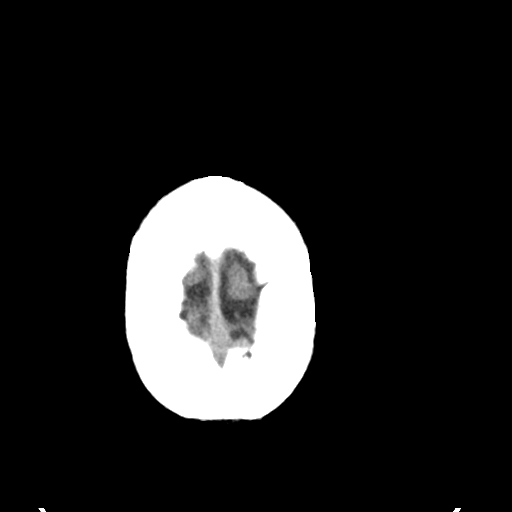
[im 33/35  brain]
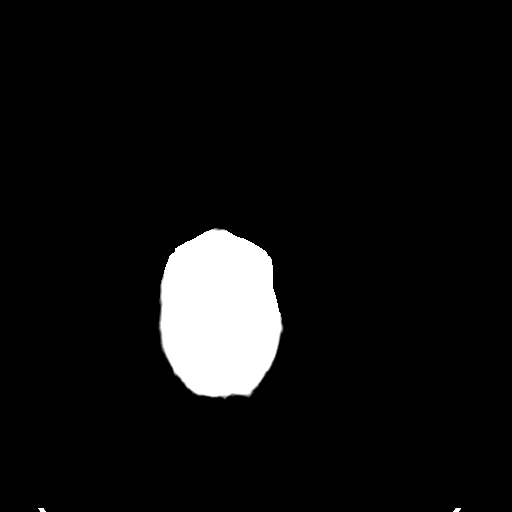

[15 of 30 positions shown; findings below may reference images not displayed]

FINDINGS: Left-sided subdural hematoma is smaller than on the prior
examination.

Redistribution of subarachnoid blood now with small amount of
intraventricular blood.

New from the prior examination is a 8.8 mm focal hematoma anterior
medial right frontal lobe which may reflect hemorrhagic contusion as
this is immediately superior to the orbital roof.

Hypodensity left parietal lobe may indicate result of small vessel
disease. No significant associated mass effect to suggest there is
an underlying lesion. Attention to this on followup.

No CT evidence of large acute thrombotic infarct.

No intracranial mass lesion seen separate from above described
findings.

No skull fracture.

No hydrocephalus.

Vascular calcifications.

Minimal air-fluid level right maxillary sinus.
IMPRESSION: Left-sided subdural hematoma is smaller than on the prior
examination now barely visualized.

Redistribution of subarachnoid blood now with small amount of
intraventricular blood.

New from the prior examination is a 8.8 mm focal hematoma anterior
medial right frontal lobe which may reflect hemorrhagic contusion as
this is immediately superior to the orbital roof.

Hypodensity left parietal lobe may indicate result of small vessel
disease. No significant associated mass effect to suggest there is
an underlying lesion. Attention to this on followup.

## 2015-10-25 DIAGNOSIS — M1990 Unspecified osteoarthritis, unspecified site: Secondary | ICD-10-CM | POA: Diagnosis not present

## 2015-10-25 DIAGNOSIS — Z6833 Body mass index (BMI) 33.0-33.9, adult: Secondary | ICD-10-CM | POA: Diagnosis not present

## 2015-10-25 DIAGNOSIS — M25562 Pain in left knee: Secondary | ICD-10-CM | POA: Diagnosis not present

## 2015-12-07 IMAGING — CR DG CHEST 2V
2 series · 2 of 2 positions shown · non-contrast
Comparison: 01/03/2014.

CLINICAL DATA: Sore arm.  Pacemaker placement.

EXAM:
CHEST  2 VIEW

[chest pa]
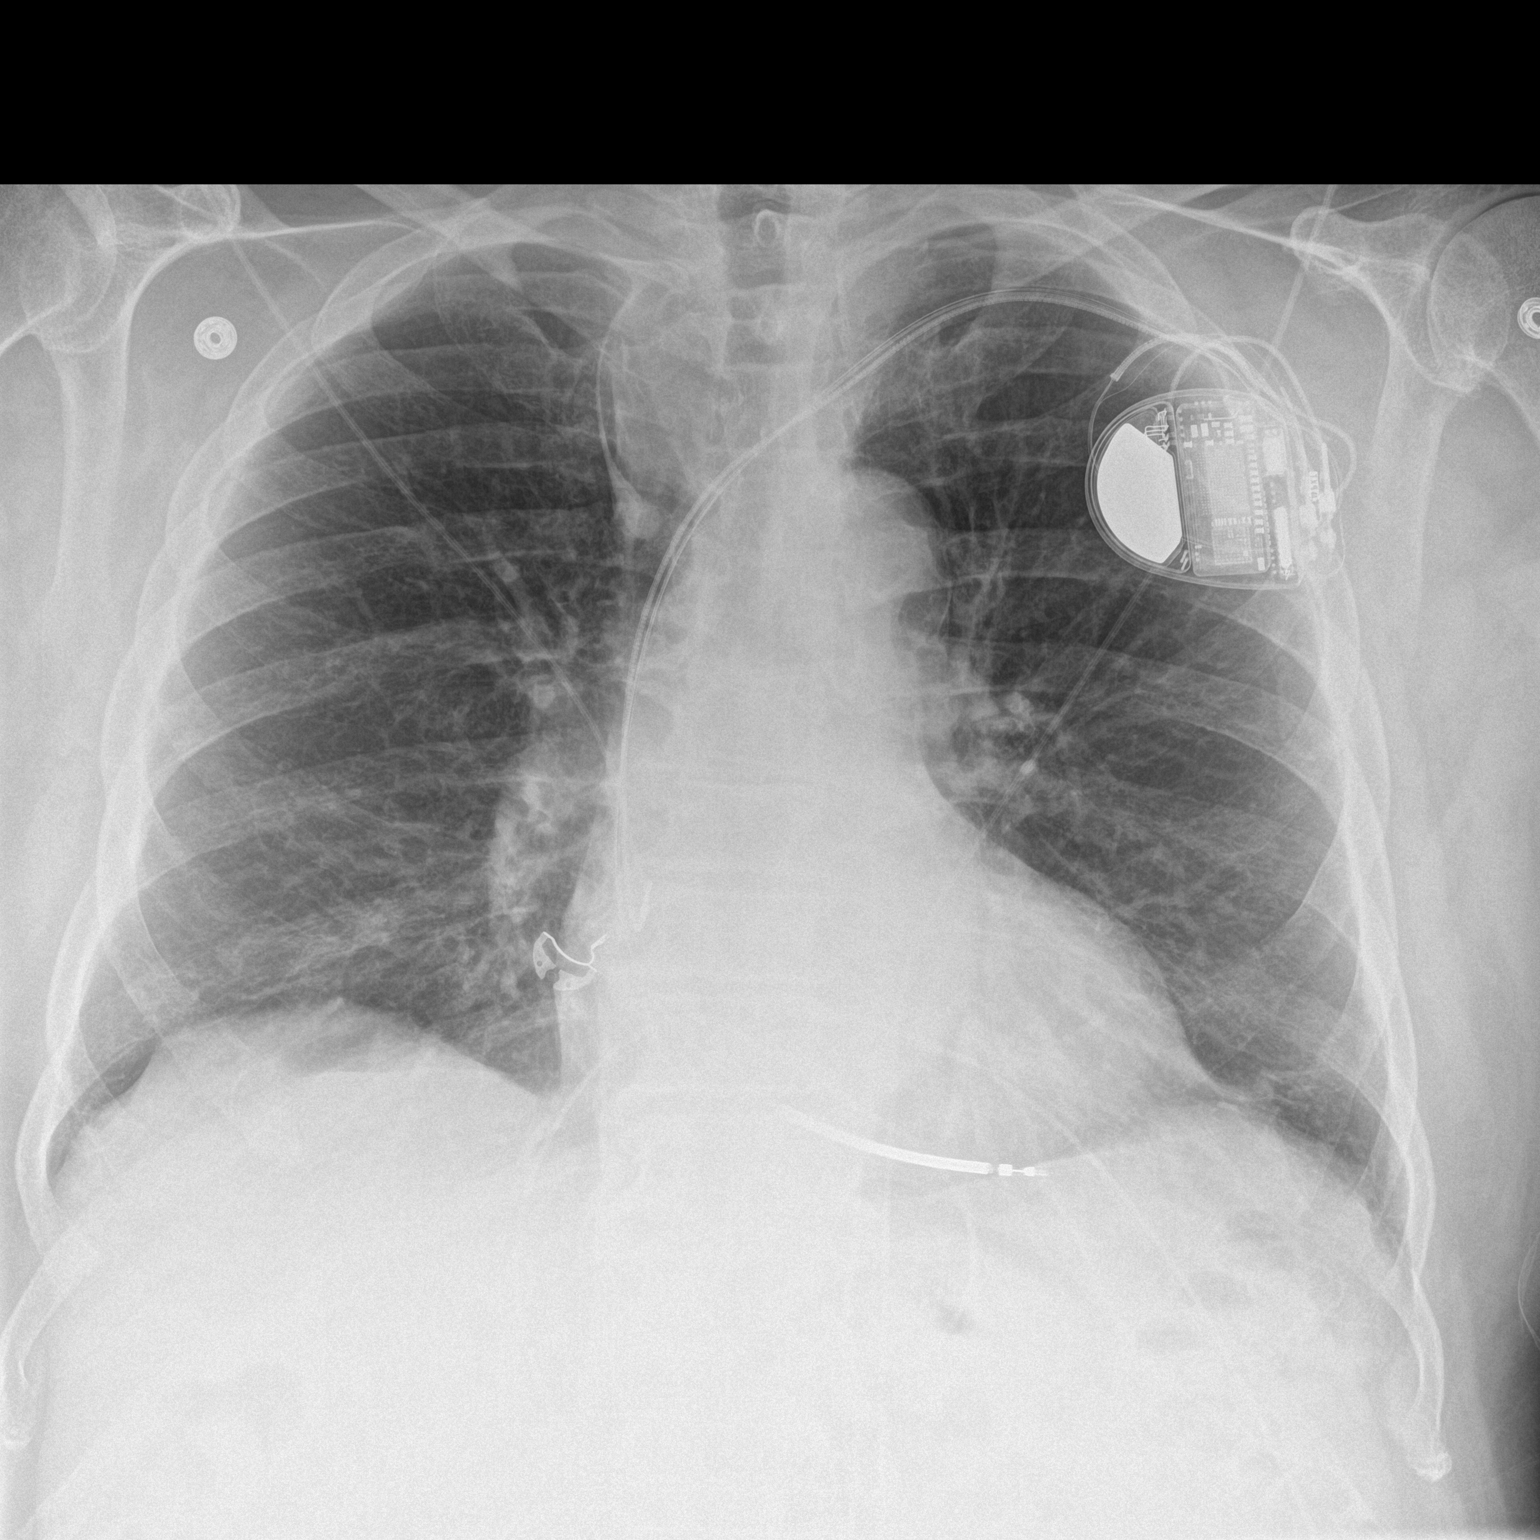

[chest lat]
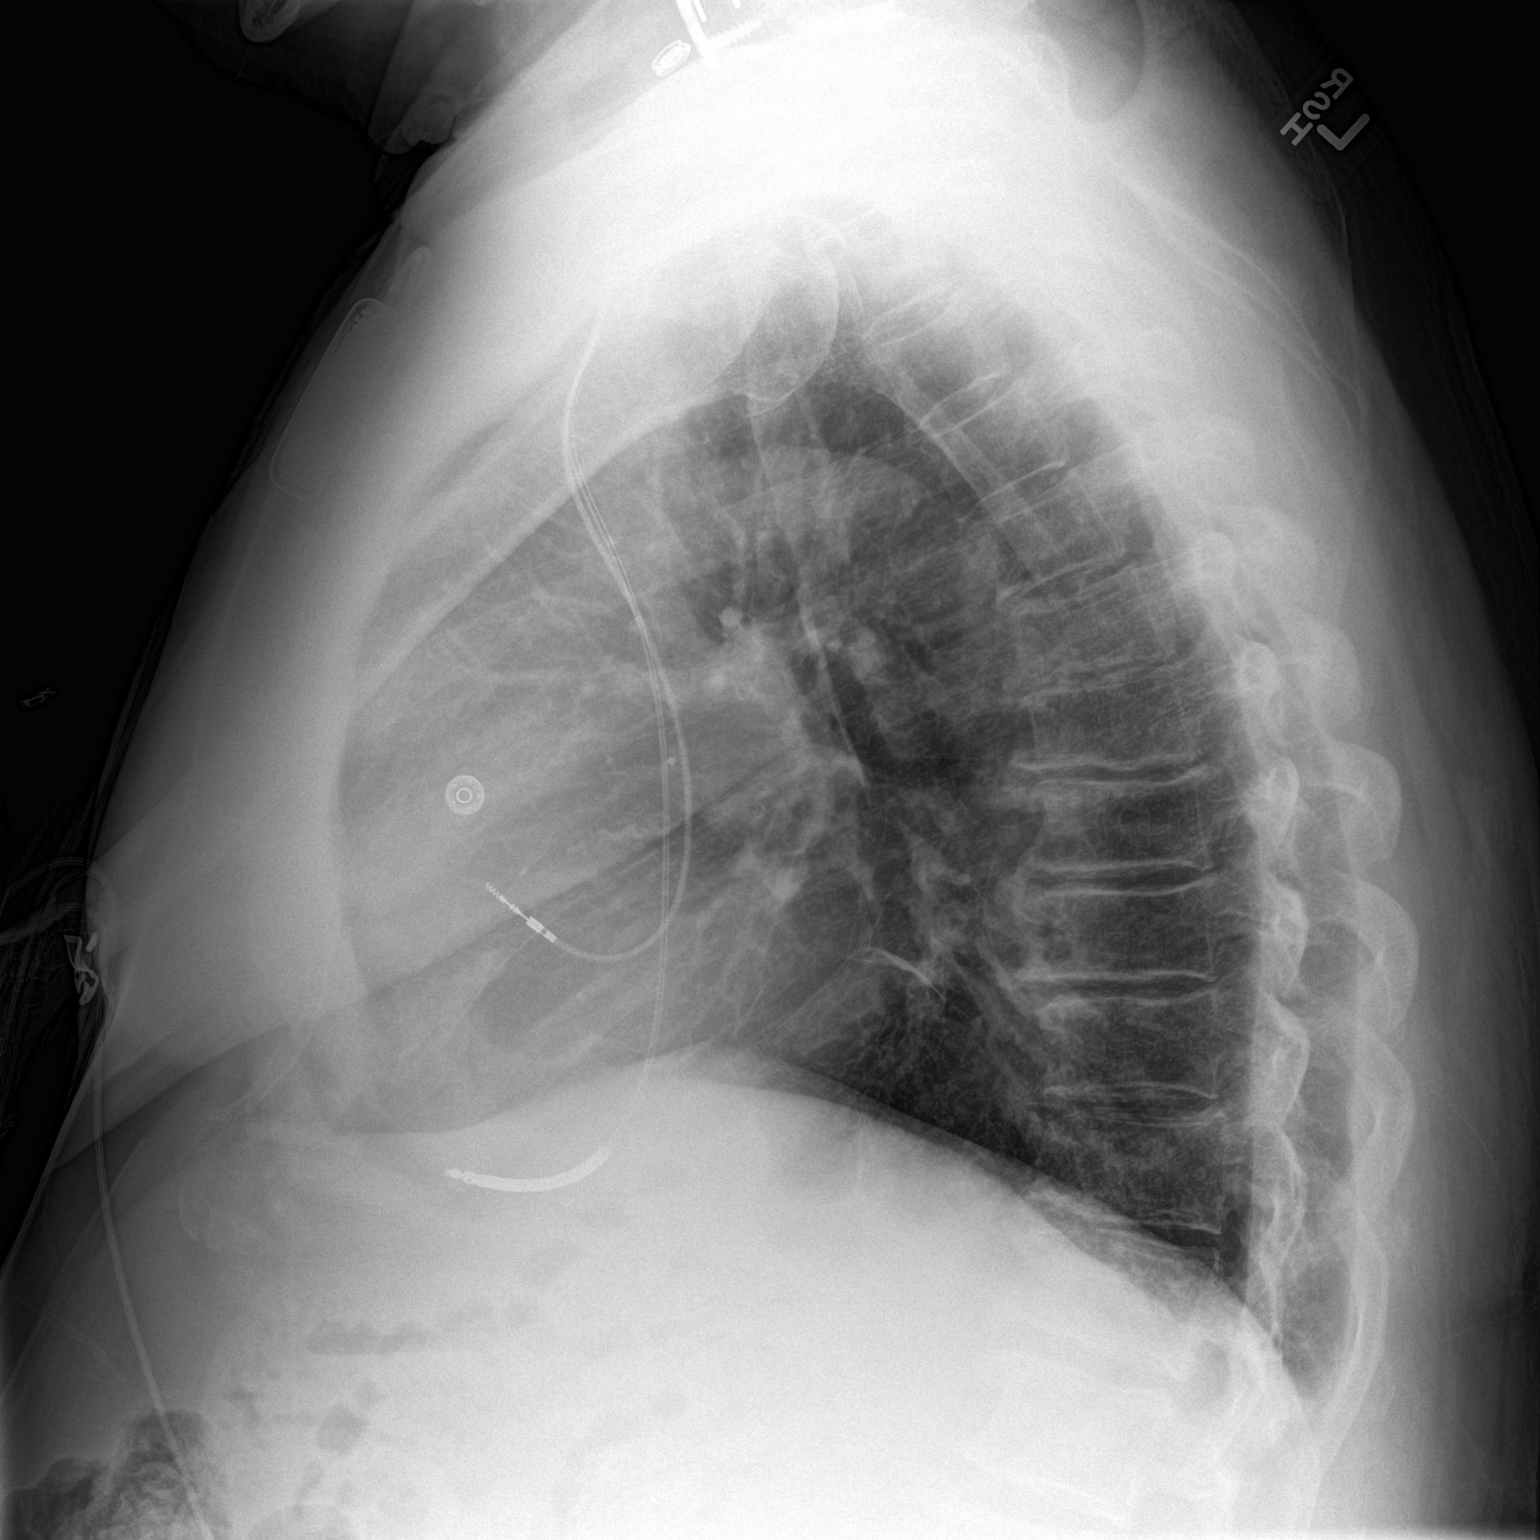

[2 of 2 positions shown; findings below may reference images not displayed]

FINDINGS: Mediastinum hilar structures are normal. Mild cardiomegaly with
normal pulmonary vascularity. Cardiac pacer with lead tips in the
right atrium and right ventricle. No focal infiltrate. Mild chronic
interstitial prominence. No focal pulmonary infiltrate. No pleural
effusion pneumothorax view prior cervical spine fusion.
IMPRESSION: 1. Cardiac pacer with lead tips in the right atrium and right
ventricle. Mild stable cardiomegaly, no CHF.
2. No acute pulmonary disease.  No evidence of pneumothorax.

## 2016-01-04 DIAGNOSIS — K210 Gastro-esophageal reflux disease with esophagitis: Secondary | ICD-10-CM | POA: Diagnosis not present

## 2016-01-04 DIAGNOSIS — I10 Essential (primary) hypertension: Secondary | ICD-10-CM | POA: Diagnosis not present

## 2016-01-04 DIAGNOSIS — E782 Mixed hyperlipidemia: Secondary | ICD-10-CM | POA: Diagnosis not present

## 2016-01-04 DIAGNOSIS — R040 Epistaxis: Secondary | ICD-10-CM | POA: Diagnosis not present

## 2016-01-04 DIAGNOSIS — E1142 Type 2 diabetes mellitus with diabetic polyneuropathy: Secondary | ICD-10-CM | POA: Diagnosis not present

## 2016-01-09 DIAGNOSIS — I10 Essential (primary) hypertension: Secondary | ICD-10-CM | POA: Diagnosis not present

## 2016-01-09 DIAGNOSIS — Z0001 Encounter for general adult medical examination with abnormal findings: Secondary | ICD-10-CM | POA: Diagnosis not present

## 2016-01-09 DIAGNOSIS — E1142 Type 2 diabetes mellitus with diabetic polyneuropathy: Secondary | ICD-10-CM | POA: Diagnosis not present

## 2016-01-09 DIAGNOSIS — Z1212 Encounter for screening for malignant neoplasm of rectum: Secondary | ICD-10-CM | POA: Diagnosis not present

## 2016-01-09 DIAGNOSIS — G6289 Other specified polyneuropathies: Secondary | ICD-10-CM | POA: Diagnosis not present

## 2016-01-09 DIAGNOSIS — Z23 Encounter for immunization: Secondary | ICD-10-CM | POA: Diagnosis not present

## 2016-01-09 DIAGNOSIS — E782 Mixed hyperlipidemia: Secondary | ICD-10-CM | POA: Diagnosis not present

## 2016-01-09 DIAGNOSIS — M1711 Unilateral primary osteoarthritis, right knee: Secondary | ICD-10-CM | POA: Diagnosis not present

## 2016-04-08 ENCOUNTER — Ambulatory Visit (INDEPENDENT_AMBULATORY_CARE_PROVIDER_SITE_OTHER): Payer: Medicare Other | Admitting: *Deleted

## 2016-04-08 DIAGNOSIS — I5022 Chronic systolic (congestive) heart failure: Secondary | ICD-10-CM

## 2016-04-08 DIAGNOSIS — I4901 Ventricular fibrillation: Secondary | ICD-10-CM

## 2016-04-08 DIAGNOSIS — I469 Cardiac arrest, cause unspecified: Secondary | ICD-10-CM

## 2016-04-10 ENCOUNTER — Encounter: Payer: Self-pay | Admitting: Cardiology

## 2016-04-10 LAB — CUP PACEART REMOTE DEVICE CHECK
Battery Remaining Longevity: 113 mo
Battery Voltage: 2.97 V
Brady Statistic RA Percent Paced: 6.05 %
Brady Statistic RV Percent Paced: 0.04 %
HIGH POWER IMPEDANCE MEASURED VALUE: 76 Ohm
Implantable Lead Location: 753860
Implantable Lead Model: 6935
Implantable Pulse Generator Implant Date: 20151202
Lead Channel Impedance Value: 399 Ohm
Lead Channel Impedance Value: 437 Ohm
Lead Channel Pacing Threshold Amplitude: 0.625 V
Lead Channel Pacing Threshold Pulse Width: 0.4 ms
Lead Channel Sensing Intrinsic Amplitude: 2.875 mV
Lead Channel Setting Pacing Amplitude: 2 V
Lead Channel Setting Sensing Sensitivity: 0.3 mV
MDC IDC LEAD IMPLANT DT: 20151202
MDC IDC MSMT LEADCHNL RA IMPEDANCE VALUE: 437 Ohm
MDC IDC MSMT LEADCHNL RA PACING THRESHOLD AMPLITUDE: 0.5 V
MDC IDC MSMT LEADCHNL RA SENSING INTR AMPL: 2.875 mV
MDC IDC MSMT LEADCHNL RV PACING THRESHOLD PULSEWIDTH: 0.4 ms
MDC IDC MSMT LEADCHNL RV SENSING INTR AMPL: 10.875 mV
MDC IDC MSMT LEADCHNL RV SENSING INTR AMPL: 10.875 mV
MDC IDC SESS DTM: 20180121095537
MDC IDC SET LEADCHNL RA PACING AMPLITUDE: 1.5 V
MDC IDC SET LEADCHNL RV PACING PULSEWIDTH: 0.4 ms
MDC IDC STAT BRADY AP VP PERCENT: 0.01 %
MDC IDC STAT BRADY AP VS PERCENT: 6.08 %
MDC IDC STAT BRADY AS VP PERCENT: 0.03 %
MDC IDC STAT BRADY AS VS PERCENT: 93.88 %

## 2016-04-10 NOTE — Progress Notes (Signed)
Remote ICD transmission.   

## 2016-04-24 ENCOUNTER — Encounter: Payer: Self-pay | Admitting: Cardiology

## 2016-04-25 ENCOUNTER — Other Ambulatory Visit: Payer: Self-pay | Admitting: Internal Medicine

## 2016-05-14 DIAGNOSIS — E1142 Type 2 diabetes mellitus with diabetic polyneuropathy: Secondary | ICD-10-CM | POA: Diagnosis not present

## 2016-05-14 DIAGNOSIS — K210 Gastro-esophageal reflux disease with esophagitis: Secondary | ICD-10-CM | POA: Diagnosis not present

## 2016-05-14 DIAGNOSIS — I10 Essential (primary) hypertension: Secondary | ICD-10-CM | POA: Diagnosis not present

## 2016-05-14 DIAGNOSIS — E782 Mixed hyperlipidemia: Secondary | ICD-10-CM | POA: Diagnosis not present

## 2016-05-16 DIAGNOSIS — E782 Mixed hyperlipidemia: Secondary | ICD-10-CM | POA: Diagnosis not present

## 2016-05-16 DIAGNOSIS — G6289 Other specified polyneuropathies: Secondary | ICD-10-CM | POA: Diagnosis not present

## 2016-05-16 DIAGNOSIS — I2510 Atherosclerotic heart disease of native coronary artery without angina pectoris: Secondary | ICD-10-CM | POA: Diagnosis not present

## 2016-05-16 DIAGNOSIS — E6609 Other obesity due to excess calories: Secondary | ICD-10-CM | POA: Diagnosis not present

## 2016-05-16 DIAGNOSIS — E1142 Type 2 diabetes mellitus with diabetic polyneuropathy: Secondary | ICD-10-CM | POA: Diagnosis not present

## 2016-05-16 DIAGNOSIS — I10 Essential (primary) hypertension: Secondary | ICD-10-CM | POA: Diagnosis not present

## 2016-05-16 DIAGNOSIS — K219 Gastro-esophageal reflux disease without esophagitis: Secondary | ICD-10-CM | POA: Diagnosis not present

## 2016-05-16 DIAGNOSIS — M1711 Unilateral primary osteoarthritis, right knee: Secondary | ICD-10-CM | POA: Diagnosis not present

## 2016-06-25 ENCOUNTER — Other Ambulatory Visit: Payer: Self-pay | Admitting: Internal Medicine

## 2016-06-27 DIAGNOSIS — Z6835 Body mass index (BMI) 35.0-35.9, adult: Secondary | ICD-10-CM | POA: Diagnosis not present

## 2016-06-27 DIAGNOSIS — M5030 Other cervical disc degeneration, unspecified cervical region: Secondary | ICD-10-CM | POA: Diagnosis not present

## 2016-07-08 ENCOUNTER — Ambulatory Visit (INDEPENDENT_AMBULATORY_CARE_PROVIDER_SITE_OTHER): Payer: Medicare Other | Admitting: *Deleted

## 2016-07-08 ENCOUNTER — Telehealth: Payer: Self-pay | Admitting: Cardiology

## 2016-07-08 DIAGNOSIS — I469 Cardiac arrest, cause unspecified: Secondary | ICD-10-CM

## 2016-07-08 DIAGNOSIS — I5022 Chronic systolic (congestive) heart failure: Secondary | ICD-10-CM

## 2016-07-08 DIAGNOSIS — I4901 Ventricular fibrillation: Secondary | ICD-10-CM | POA: Diagnosis not present

## 2016-07-08 NOTE — Telephone Encounter (Signed)
Spoke with pt and reminded pt of remote transmission that is due today. Pt verbalized understanding.   

## 2016-07-09 NOTE — Progress Notes (Signed)
Remote ICD transmission.   

## 2016-07-11 ENCOUNTER — Encounter: Payer: Self-pay | Admitting: Cardiology

## 2016-07-11 LAB — CUP PACEART REMOTE DEVICE CHECK
Brady Statistic AP VP Percent: 0.01 %
Brady Statistic AP VS Percent: 5.29 %
Brady Statistic AS VP Percent: 0.04 %
Brady Statistic AS VS Percent: 94.66 %
Brady Statistic RV Percent Paced: 0.04 %
HighPow Impedance: 81 Ohm
Implantable Lead Model: 6935
Lead Channel Impedance Value: 399 Ohm
Lead Channel Impedance Value: 513 Ohm
Lead Channel Pacing Threshold Amplitude: 0.625 V
Lead Channel Pacing Threshold Pulse Width: 0.4 ms
Lead Channel Sensing Intrinsic Amplitude: 10.75 mV
Lead Channel Sensing Intrinsic Amplitude: 10.75 mV
Lead Channel Sensing Intrinsic Amplitude: 3.125 mV
Lead Channel Setting Pacing Amplitude: 1.75 V
Lead Channel Setting Pacing Amplitude: 2 V
Lead Channel Setting Sensing Sensitivity: 0.3 mV
MDC IDC LEAD IMPLANT DT: 20151202
MDC IDC LEAD LOCATION: 753860
MDC IDC MSMT BATTERY REMAINING LONGEVITY: 106 mo
MDC IDC MSMT BATTERY VOLTAGE: 2.99 V
MDC IDC MSMT LEADCHNL RA PACING THRESHOLD AMPLITUDE: 0.875 V
MDC IDC MSMT LEADCHNL RA PACING THRESHOLD PULSEWIDTH: 0.4 ms
MDC IDC MSMT LEADCHNL RA SENSING INTR AMPL: 3.125 mV
MDC IDC MSMT LEADCHNL RV IMPEDANCE VALUE: 456 Ohm
MDC IDC PG IMPLANT DT: 20151202
MDC IDC SESS DTM: 20180423171704
MDC IDC SET LEADCHNL RV PACING PULSEWIDTH: 0.4 ms
MDC IDC STAT BRADY RA PERCENT PACED: 5.27 %

## 2016-07-22 ENCOUNTER — Ambulatory Visit (INDEPENDENT_AMBULATORY_CARE_PROVIDER_SITE_OTHER): Payer: Medicare Other | Admitting: Internal Medicine

## 2016-07-22 ENCOUNTER — Encounter: Payer: Self-pay | Admitting: Internal Medicine

## 2016-07-22 VITALS — BP 138/80 | HR 62 | Ht 70.0 in | Wt 247.0 lb

## 2016-07-22 DIAGNOSIS — I480 Paroxysmal atrial fibrillation: Secondary | ICD-10-CM

## 2016-07-22 LAB — CUP PACEART INCLINIC DEVICE CHECK
Battery Remaining Longevity: 106 mo
Battery Voltage: 2.99 V
Brady Statistic AP VP Percent: 0.01 %
Date Time Interrogation Session: 20180507093817
HighPow Impedance: 75 Ohm
Implantable Lead Location: 753860
Implantable Lead Model: 6935
Implantable Pulse Generator Implant Date: 20151202
Lead Channel Impedance Value: 399 Ohm
Lead Channel Pacing Threshold Amplitude: 0.875 V
Lead Channel Pacing Threshold Pulse Width: 0.4 ms
Lead Channel Sensing Intrinsic Amplitude: 2.75 mV
Lead Channel Setting Pacing Amplitude: 2 V
MDC IDC LEAD IMPLANT DT: 20151202
MDC IDC MSMT LEADCHNL RA IMPEDANCE VALUE: 532 Ohm
MDC IDC MSMT LEADCHNL RA PACING THRESHOLD PULSEWIDTH: 0.4 ms
MDC IDC MSMT LEADCHNL RV IMPEDANCE VALUE: 494 Ohm
MDC IDC MSMT LEADCHNL RV PACING THRESHOLD AMPLITUDE: 0.625 V
MDC IDC MSMT LEADCHNL RV SENSING INTR AMPL: 9.75 mV
MDC IDC SET LEADCHNL RA PACING AMPLITUDE: 2 V
MDC IDC SET LEADCHNL RV PACING PULSEWIDTH: 0.4 ms
MDC IDC SET LEADCHNL RV SENSING SENSITIVITY: 0.3 mV
MDC IDC STAT BRADY AP VS PERCENT: 6.36 %
MDC IDC STAT BRADY AS VP PERCENT: 0.03 %
MDC IDC STAT BRADY AS VS PERCENT: 93.6 %
MDC IDC STAT BRADY RA PERCENT PACED: 6.33 %
MDC IDC STAT BRADY RV PERCENT PACED: 0.04 %

## 2016-07-22 MED ORDER — APIXABAN 5 MG PO TABS: 5.0000 mg | ORAL_TABLET | Freq: Two times a day (BID) | ORAL | 3 refills | Status: DC

## 2016-07-22 NOTE — Patient Instructions (Addendum)
Medication Instructions:  STOP ASPIRIN STOP PLAVIX  START ELIQUIS 5 MG TWO TIMES DAILY   Labwork: NONE  Testing/Procedures: NONE  Follow-Up: Your physician wants you to follow-up in: 1 YEAR.  You will receive a reminder letter in the mail two months in advance. If you don't receive a letter, please call our office to schedule the follow-up appointment.   Any Other Special Instructions Will Be Listed Below (If Applicable).  Remote monitoring is used to monitor your Pacemaker of ICD from home. This monitoring reduces the number of office visits required to check your device to one time per year. It allows [place] to keep an eye on the functioning of your device to ensure it is working properly. You are scheduled for a device check from home on October 21, 2016. You may send your transmission at any time that day. If you have a wireless device, the transmission will be sent automatically. After your physician reviews your transmission, you will receive a postcard with your next transmission date.     If you need a refill on your cardiac medications before your next appointment, please call your pharmacy.

## 2016-07-22 NOTE — Progress Notes (Signed)
HPI [name] returns today for followup. He is a pleasant 76 yo man with a h/o CAD, LV dysfunction who sustained a VF arrest a couple of years ago. He underwent catheterization and was found to have a chronically occluded RCA. He underwent ICD implant and returns today for followup. In the interim he has done well with no chest pain or sob. No syncope. No ICD shock. He was found to have PAF on his ICD for upto 4 hours at a time. He has been asymptomatic.  Marland Kitchen No Known Allergies   Current Outpatient Prescriptions  Medication Sig Dispense Refill  . allopurinol (ZYLOPRIM) 100 MG tablet Take 100 mg by mouth daily.     Marland Kitchen aspirin 81 MG tablet Take 81 mg by mouth daily.    . clopidogrel (PLAVIX) 75 MG tablet Take 75 mg by mouth daily.    Marland Kitchen glucosamine-chondroitin 500-400 MG tablet Take 1 tablet by mouth 2 (two) times daily.     . hydrochlorothiazide (HYDRODIURIL) 25 MG tablet Take 25 mg by mouth daily.    . insulin NPH-regular Human (NOVOLIN 70/30) (70-30) 100 UNIT/ML injection Inject 80 Units into the skin.    Marland Kitchen lisinopril (PRINIVIL,ZESTRIL) 40 MG tablet Take 40 mg by mouth daily.    . metFORMIN (GLUCOPHAGE) 1000 MG tablet Take 1,000 mg by mouth 2 (two) times daily with a meal.    . metoprolol tartrate (LOPRESSOR) 25 MG tablet TAKE ONE TABLET BY MOUTH TWICE DAILY 60 tablet 0   No current facility-administered medications for this visit.      Past Medical History:  Diagnosis Date  . CAD (coronary artery disease)    a. RCA PCI in 2003 b. 2008-RCA was occluded; residual CAD- 70% CFX and 50% LAD with normal LVF c. low risk Myoview in Dec 2011  . Diabetes mellitus ([place])   . Dyslipidemia   . GERD (gastroesophageal reflux disease)   . HTN (hypertension)   . Ischemic cardiomyopathy    a. s/p MDT dual chamber ICD 02-2014  . Osteoarthritis     ROS:   All systems reviewed and negative except as noted in the HPI.   Past Surgical History:  Procedure Laterality Date  . CARDIAC  CATHETERIZATION  2008   occluded RCA- med Rx  . CATARACT EXTRACTION Right July 2011  . CERVICAL SPINE SURGERY  Feb 2010  . CORONARY ANGIOPLASTY  2003   RCA PCI  . IMPLANTABLE CARDIOVERTER DEFIBRILLATOR IMPLANT  02-16-2014   MDT Evera dual chamber ICD implanted by Dr Lovena Le  . IMPLANTABLE CARDIOVERTER DEFIBRILLATOR IMPLANT N/A 02/16/2014   Procedure: IMPLANTABLE CARDIOVERTER DEFIBRILLATOR IMPLANT;  Surgeon: Evans Lance, MD;  Location: [facility] CATH LAB;  Service: Cardiovascular;  Laterality: N/A;  . LEFT HEART CATHETERIZATION WITH CORONARY ANGIOGRAM N/A 01/05/2014   Procedure: LEFT HEART CATHETERIZATION WITH CORONARY ANGIOGRAM;  Surgeon: Wellington Hampshire, MD;  Location: [place] CATH LAB;  Service: Cardiovascular;  Laterality: N/A;     Family History  Problem Relation Age of Onset  . Diabetes       Social History   Social History  . Marital status: Married    Spouse name: N/A  . Number of children: N/A  . Years of education: N/A   Occupational History  . Not on file.   Social History Main Topics  . Smoking status: Former Smoker    Quit date: 03/25/1983  . Smokeless tobacco: Former Systems developer    Quit date: 01/04/1983  . Alcohol use Yes  Comment: ONCE IN A WHILE   . Drug use: No  . Sexual activity: Yes   Other Topics Concern  . Not on file   Social History Narrative  . No narrative on file     BP 138/80   Pulse 62   Ht 5\' 10"  (1.778 m)   Wt 247 lb (112 kg)   SpO2 95%   BMI 35.44 kg/m   Physical Exam:  Well appearing 76 yo man, NAD HEENT: Unremarkable Neck:  No JVD, no thyromegally Back:  No CVA tenderness Lungs:  Clear with no wheezes, well healed ICD incision. HEART:  Regular rate rhythm, no murmurs, no rubs, no clicks Abd:  soft, positive bowel sounds, no organomegally, no rebound, no guarding Ext:  2 plus pulses, no edema, no cyanosis, no clubbing Skin:  No rashes no nodules Neuro:  CN II through XII intact, motor grossly intact   Assess/Plan: 1. PAF - he is  in NSR today. I have discussed the indications for [facility] and he is willing to try. If Eliquis becomes too expensive we will switch him to warfarin 2. CAD - he denies anginal symptoms. Will follow. 3. VT/VF - he has had no recurrent ventricular arrhythmias. 4. ICD - his medtronic device is working normally. Will recheck in several months.  Mikle Bosworth.D.

## 2016-07-25 ENCOUNTER — Other Ambulatory Visit: Payer: Self-pay | Admitting: Cardiovascular Disease

## 2016-07-25 ENCOUNTER — Encounter: Payer: Self-pay | Admitting: Cardiology

## 2016-08-05 ENCOUNTER — Telehealth: Payer: Self-pay | Admitting: Cardiology

## 2016-08-05 NOTE — Telephone Encounter (Signed)
Patient wife called and stated that patient alert tone on his ICD has been alarming at the same time everyday for the last couple days. Instructed pt wife to send a remote transmission. She stated that she would have patient do this when he gets home.

## 2016-08-06 NOTE — Telephone Encounter (Signed)
[name] calling due to Trevor [name]'s ICD alert tone. Transmission reviewed. Alert for >2 hr AT/AF in 1 day, unable to transmit. Appt made for [facility] Friday 08/09/16. Known PAF, on Eliquis.

## 2016-08-09 ENCOUNTER — Ambulatory Visit (INDEPENDENT_AMBULATORY_CARE_PROVIDER_SITE_OTHER): Payer: Self-pay | Admitting: *Deleted

## 2016-08-09 DIAGNOSIS — I480 Paroxysmal atrial fibrillation: Secondary | ICD-10-CM

## 2016-08-09 DIAGNOSIS — I5022 Chronic systolic (congestive) heart failure: Secondary | ICD-10-CM

## 2016-08-09 LAB — CUP PACEART INCLINIC DEVICE CHECK
Battery Remaining Longevity: 98 mo
Brady Statistic AP VP Percent: 0.03 %
Brady Statistic AP VS Percent: 10.93 %
Brady Statistic AS VS Percent: 89 %
Brady Statistic RV Percent Paced: 0.07 %
HIGH POWER IMPEDANCE MEASURED VALUE: 81 Ohm
Implantable Lead Model: 6935
Lead Channel Impedance Value: 399 Ohm
Lead Channel Impedance Value: 532 Ohm
Lead Channel Pacing Threshold Amplitude: 0.75 V
Lead Channel Pacing Threshold Pulse Width: 0.4 ms
Lead Channel Sensing Intrinsic Amplitude: 10.25 mV
Lead Channel Setting Pacing Amplitude: 2 V
Lead Channel Setting Pacing Amplitude: 2 V
Lead Channel Setting Pacing Pulse Width: 0.4 ms
Lead Channel Setting Sensing Sensitivity: 0.3 mV
MDC IDC LEAD IMPLANT DT: 20151202
MDC IDC LEAD LOCATION: 753860
MDC IDC MSMT BATTERY VOLTAGE: 2.96 V
MDC IDC MSMT LEADCHNL RA PACING THRESHOLD AMPLITUDE: 1 V
MDC IDC MSMT LEADCHNL RA PACING THRESHOLD PULSEWIDTH: 0.4 ms
MDC IDC MSMT LEADCHNL RA SENSING INTR AMPL: 3.375 mV
MDC IDC MSMT LEADCHNL RA SENSING INTR AMPL: 3.375 mV
MDC IDC MSMT LEADCHNL RV IMPEDANCE VALUE: 494 Ohm
MDC IDC MSMT LEADCHNL RV SENSING INTR AMPL: 11.5 mV
MDC IDC PG IMPLANT DT: 20151202
MDC IDC SESS DTM: 20180525124848
MDC IDC STAT BRADY AS VP PERCENT: 0.05 %
MDC IDC STAT BRADY RA PERCENT PACED: 10.35 %

## 2016-08-09 NOTE — Progress Notes (Signed)
ICD check in clinic d/t audible alert for AF + unsuccessful CareAlert (N/C). Normal device function. Thresholds and sensing consistent with previous device measurements. Impedance trends stable over time. No evidence of any ventricular arrhythmias. 3.3% AT/AF burden, max dur. 4hrs + Eliquis. Histogram distribution appropriate for patient and level of activity. Stable thoracic impedance. Alert for AT/AF burden d/c'd. Device programmed at appropriate safety margins. Device programmed to optimize intrinsic conduction. Estimated longevity 8.1 years. Pt will follow up as scheduled. Alert tones demonstrated for patient.

## 2016-09-17 DIAGNOSIS — I10 Essential (primary) hypertension: Secondary | ICD-10-CM | POA: Diagnosis not present

## 2016-09-17 DIAGNOSIS — G6289 Other specified polyneuropathies: Secondary | ICD-10-CM | POA: Diagnosis not present

## 2016-09-17 DIAGNOSIS — Z9189 Other specified personal risk factors, not elsewhere classified: Secondary | ICD-10-CM | POA: Diagnosis not present

## 2016-09-17 DIAGNOSIS — E1142 Type 2 diabetes mellitus with diabetic polyneuropathy: Secondary | ICD-10-CM | POA: Diagnosis not present

## 2016-09-17 DIAGNOSIS — K210 Gastro-esophageal reflux disease with esophagitis: Secondary | ICD-10-CM | POA: Diagnosis not present

## 2016-09-17 DIAGNOSIS — E782 Mixed hyperlipidemia: Secondary | ICD-10-CM | POA: Diagnosis not present

## 2016-09-19 DIAGNOSIS — K219 Gastro-esophageal reflux disease without esophagitis: Secondary | ICD-10-CM | POA: Diagnosis not present

## 2016-09-19 DIAGNOSIS — G6289 Other specified polyneuropathies: Secondary | ICD-10-CM | POA: Diagnosis not present

## 2016-09-19 DIAGNOSIS — Z6836 Body mass index (BMI) 36.0-36.9, adult: Secondary | ICD-10-CM | POA: Diagnosis not present

## 2016-09-19 DIAGNOSIS — I2510 Atherosclerotic heart disease of native coronary artery without angina pectoris: Secondary | ICD-10-CM | POA: Diagnosis not present

## 2016-09-19 DIAGNOSIS — E782 Mixed hyperlipidemia: Secondary | ICD-10-CM | POA: Diagnosis not present

## 2016-09-19 DIAGNOSIS — E1142 Type 2 diabetes mellitus with diabetic polyneuropathy: Secondary | ICD-10-CM | POA: Diagnosis not present

## 2016-09-19 DIAGNOSIS — M1711 Unilateral primary osteoarthritis, right knee: Secondary | ICD-10-CM | POA: Diagnosis not present

## 2016-09-19 DIAGNOSIS — I10 Essential (primary) hypertension: Secondary | ICD-10-CM | POA: Diagnosis not present

## 2016-10-07 ENCOUNTER — Encounter: Payer: Medicare Other | Admitting: *Deleted

## 2016-10-11 ENCOUNTER — Encounter: Payer: Self-pay | Admitting: Cardiology

## 2016-10-21 ENCOUNTER — Encounter: Payer: Medicare Other | Admitting: *Deleted

## 2016-10-23 ENCOUNTER — Encounter: Payer: Self-pay | Admitting: Cardiology

## 2016-11-08 ENCOUNTER — Other Ambulatory Visit: Payer: Self-pay | Admitting: Internal Medicine

## 2016-11-08 ENCOUNTER — Telehealth: Payer: Self-pay | Admitting: Internal Medicine

## 2016-11-08 NOTE — Telephone Encounter (Signed)
New message    Pt wife is calling stating that pt has been having trouble with his machine. So they a being sent a new one. She said once they get it they will send a transmission.

## 2016-11-08 NOTE — Telephone Encounter (Signed)
Noted in [employer]. 

## 2017-01-13 DIAGNOSIS — I10 Essential (primary) hypertension: Secondary | ICD-10-CM | POA: Diagnosis not present

## 2017-01-13 DIAGNOSIS — Z9189 Other specified personal risk factors, not elsewhere classified: Secondary | ICD-10-CM | POA: Diagnosis not present

## 2017-01-13 DIAGNOSIS — E782 Mixed hyperlipidemia: Secondary | ICD-10-CM | POA: Diagnosis not present

## 2017-01-13 DIAGNOSIS — K210 Gastro-esophageal reflux disease with esophagitis: Secondary | ICD-10-CM | POA: Diagnosis not present

## 2017-01-13 DIAGNOSIS — E1142 Type 2 diabetes mellitus with diabetic polyneuropathy: Secondary | ICD-10-CM | POA: Diagnosis not present

## 2017-01-17 DIAGNOSIS — E782 Mixed hyperlipidemia: Secondary | ICD-10-CM | POA: Diagnosis not present

## 2017-01-17 DIAGNOSIS — G6289 Other specified polyneuropathies: Secondary | ICD-10-CM | POA: Diagnosis not present

## 2017-01-17 DIAGNOSIS — Z1212 Encounter for screening for malignant neoplasm of rectum: Secondary | ICD-10-CM | POA: Diagnosis not present

## 2017-01-17 DIAGNOSIS — M1711 Unilateral primary osteoarthritis, right knee: Secondary | ICD-10-CM | POA: Diagnosis not present

## 2017-01-17 DIAGNOSIS — I10 Essential (primary) hypertension: Secondary | ICD-10-CM | POA: Diagnosis not present

## 2017-01-17 DIAGNOSIS — Z23 Encounter for immunization: Secondary | ICD-10-CM | POA: Diagnosis not present

## 2017-01-17 DIAGNOSIS — I2510 Atherosclerotic heart disease of native coronary artery without angina pectoris: Secondary | ICD-10-CM | POA: Diagnosis not present

## 2017-01-17 DIAGNOSIS — E1142 Type 2 diabetes mellitus with diabetic polyneuropathy: Secondary | ICD-10-CM | POA: Diagnosis not present

## 2017-01-17 DIAGNOSIS — Z9189 Other specified personal risk factors, not elsewhere classified: Secondary | ICD-10-CM | POA: Diagnosis not present

## 2017-01-17 DIAGNOSIS — K219 Gastro-esophageal reflux disease without esophagitis: Secondary | ICD-10-CM | POA: Diagnosis not present

## 2017-01-17 DIAGNOSIS — M545 Low back pain: Secondary | ICD-10-CM | POA: Diagnosis not present

## 2017-06-05 DIAGNOSIS — Z9189 Other specified personal risk factors, not elsewhere classified: Secondary | ICD-10-CM | POA: Diagnosis not present

## 2017-06-05 DIAGNOSIS — M1990 Unspecified osteoarthritis, unspecified site: Secondary | ICD-10-CM | POA: Diagnosis not present

## 2017-06-05 DIAGNOSIS — G6289 Other specified polyneuropathies: Secondary | ICD-10-CM | POA: Diagnosis not present

## 2017-06-05 DIAGNOSIS — I10 Essential (primary) hypertension: Secondary | ICD-10-CM | POA: Diagnosis not present

## 2017-06-05 DIAGNOSIS — I25111 Atherosclerotic heart disease of native coronary artery with angina pectoris with documented spasm: Secondary | ICD-10-CM | POA: Diagnosis not present

## 2017-06-05 DIAGNOSIS — K210 Gastro-esophageal reflux disease with esophagitis: Secondary | ICD-10-CM | POA: Diagnosis not present

## 2017-06-05 DIAGNOSIS — E1142 Type 2 diabetes mellitus with diabetic polyneuropathy: Secondary | ICD-10-CM | POA: Diagnosis not present

## 2017-06-05 DIAGNOSIS — E782 Mixed hyperlipidemia: Secondary | ICD-10-CM | POA: Diagnosis not present

## 2017-06-09 DIAGNOSIS — E782 Mixed hyperlipidemia: Secondary | ICD-10-CM | POA: Diagnosis not present

## 2017-06-09 DIAGNOSIS — G6289 Other specified polyneuropathies: Secondary | ICD-10-CM | POA: Diagnosis not present

## 2017-06-09 DIAGNOSIS — I2510 Atherosclerotic heart disease of native coronary artery without angina pectoris: Secondary | ICD-10-CM | POA: Diagnosis not present

## 2017-06-09 DIAGNOSIS — E1142 Type 2 diabetes mellitus with diabetic polyneuropathy: Secondary | ICD-10-CM | POA: Diagnosis not present

## 2017-06-09 DIAGNOSIS — K219 Gastro-esophageal reflux disease without esophagitis: Secondary | ICD-10-CM | POA: Diagnosis not present

## 2017-06-09 DIAGNOSIS — Z6834 Body mass index (BMI) 34.0-34.9, adult: Secondary | ICD-10-CM | POA: Diagnosis not present

## 2017-06-09 DIAGNOSIS — I482 Chronic atrial fibrillation: Secondary | ICD-10-CM | POA: Diagnosis not present

## 2017-06-09 DIAGNOSIS — I10 Essential (primary) hypertension: Secondary | ICD-10-CM | POA: Diagnosis not present

## 2017-06-17 DIAGNOSIS — M1711 Unilateral primary osteoarthritis, right knee: Secondary | ICD-10-CM | POA: Diagnosis not present

## 2017-06-17 DIAGNOSIS — M5416 Radiculopathy, lumbar region: Secondary | ICD-10-CM | POA: Diagnosis not present

## 2017-06-17 DIAGNOSIS — Z950 Presence of cardiac pacemaker: Secondary | ICD-10-CM | POA: Diagnosis not present

## 2017-06-17 DIAGNOSIS — R262 Difficulty in walking, not elsewhere classified: Secondary | ICD-10-CM | POA: Diagnosis not present

## 2017-06-17 DIAGNOSIS — M545 Low back pain: Secondary | ICD-10-CM | POA: Diagnosis not present

## 2017-06-18 ENCOUNTER — Other Ambulatory Visit: Payer: Self-pay | Admitting: Internal Medicine

## 2017-06-19 DIAGNOSIS — M1711 Unilateral primary osteoarthritis, right knee: Secondary | ICD-10-CM | POA: Diagnosis not present

## 2017-06-19 DIAGNOSIS — Z950 Presence of cardiac pacemaker: Secondary | ICD-10-CM | POA: Diagnosis not present

## 2017-06-19 DIAGNOSIS — R262 Difficulty in walking, not elsewhere classified: Secondary | ICD-10-CM | POA: Diagnosis not present

## 2017-06-19 DIAGNOSIS — M5416 Radiculopathy, lumbar region: Secondary | ICD-10-CM | POA: Diagnosis not present

## 2017-06-19 DIAGNOSIS — M545 Low back pain: Secondary | ICD-10-CM | POA: Diagnosis not present

## 2017-06-24 DIAGNOSIS — Z950 Presence of cardiac pacemaker: Secondary | ICD-10-CM | POA: Diagnosis not present

## 2017-06-24 DIAGNOSIS — M5416 Radiculopathy, lumbar region: Secondary | ICD-10-CM | POA: Diagnosis not present

## 2017-06-24 DIAGNOSIS — R262 Difficulty in walking, not elsewhere classified: Secondary | ICD-10-CM | POA: Diagnosis not present

## 2017-06-24 DIAGNOSIS — M545 Low back pain: Secondary | ICD-10-CM | POA: Diagnosis not present

## 2017-06-24 DIAGNOSIS — M1711 Unilateral primary osteoarthritis, right knee: Secondary | ICD-10-CM | POA: Diagnosis not present

## 2017-07-01 DIAGNOSIS — M5416 Radiculopathy, lumbar region: Secondary | ICD-10-CM | POA: Diagnosis not present

## 2017-07-01 DIAGNOSIS — Z950 Presence of cardiac pacemaker: Secondary | ICD-10-CM | POA: Diagnosis not present

## 2017-07-01 DIAGNOSIS — M545 Low back pain: Secondary | ICD-10-CM | POA: Diagnosis not present

## 2017-07-01 DIAGNOSIS — R262 Difficulty in walking, not elsewhere classified: Secondary | ICD-10-CM | POA: Diagnosis not present

## 2017-07-01 DIAGNOSIS — M1711 Unilateral primary osteoarthritis, right knee: Secondary | ICD-10-CM | POA: Diagnosis not present

## 2017-07-03 DIAGNOSIS — M5416 Radiculopathy, lumbar region: Secondary | ICD-10-CM | POA: Diagnosis not present

## 2017-07-03 DIAGNOSIS — R262 Difficulty in walking, not elsewhere classified: Secondary | ICD-10-CM | POA: Diagnosis not present

## 2017-07-03 DIAGNOSIS — Z950 Presence of cardiac pacemaker: Secondary | ICD-10-CM | POA: Diagnosis not present

## 2017-07-03 DIAGNOSIS — M1711 Unilateral primary osteoarthritis, right knee: Secondary | ICD-10-CM | POA: Diagnosis not present

## 2017-07-03 DIAGNOSIS — M545 Low back pain: Secondary | ICD-10-CM | POA: Diagnosis not present

## 2017-07-08 DIAGNOSIS — R262 Difficulty in walking, not elsewhere classified: Secondary | ICD-10-CM | POA: Diagnosis not present

## 2017-07-08 DIAGNOSIS — Z950 Presence of cardiac pacemaker: Secondary | ICD-10-CM | POA: Diagnosis not present

## 2017-07-08 DIAGNOSIS — M1711 Unilateral primary osteoarthritis, right knee: Secondary | ICD-10-CM | POA: Diagnosis not present

## 2017-07-08 DIAGNOSIS — M5416 Radiculopathy, lumbar region: Secondary | ICD-10-CM | POA: Diagnosis not present

## 2017-07-08 DIAGNOSIS — M545 Low back pain: Secondary | ICD-10-CM | POA: Diagnosis not present

## 2017-07-10 DIAGNOSIS — M1711 Unilateral primary osteoarthritis, right knee: Secondary | ICD-10-CM | POA: Diagnosis not present

## 2017-07-10 DIAGNOSIS — Z950 Presence of cardiac pacemaker: Secondary | ICD-10-CM | POA: Diagnosis not present

## 2017-07-10 DIAGNOSIS — R262 Difficulty in walking, not elsewhere classified: Secondary | ICD-10-CM | POA: Diagnosis not present

## 2017-07-10 DIAGNOSIS — M545 Low back pain: Secondary | ICD-10-CM | POA: Diagnosis not present

## 2017-07-10 DIAGNOSIS — M5416 Radiculopathy, lumbar region: Secondary | ICD-10-CM | POA: Diagnosis not present

## 2017-07-15 DIAGNOSIS — Z950 Presence of cardiac pacemaker: Secondary | ICD-10-CM | POA: Diagnosis not present

## 2017-07-15 DIAGNOSIS — R262 Difficulty in walking, not elsewhere classified: Secondary | ICD-10-CM | POA: Diagnosis not present

## 2017-07-15 DIAGNOSIS — M1711 Unilateral primary osteoarthritis, right knee: Secondary | ICD-10-CM | POA: Diagnosis not present

## 2017-07-15 DIAGNOSIS — M545 Low back pain: Secondary | ICD-10-CM | POA: Diagnosis not present

## 2017-07-15 DIAGNOSIS — M5416 Radiculopathy, lumbar region: Secondary | ICD-10-CM | POA: Diagnosis not present

## 2017-07-17 DIAGNOSIS — M545 Low back pain: Secondary | ICD-10-CM | POA: Diagnosis not present

## 2017-07-17 DIAGNOSIS — Z950 Presence of cardiac pacemaker: Secondary | ICD-10-CM | POA: Diagnosis not present

## 2017-07-17 DIAGNOSIS — R262 Difficulty in walking, not elsewhere classified: Secondary | ICD-10-CM | POA: Diagnosis not present

## 2017-07-17 DIAGNOSIS — M1711 Unilateral primary osteoarthritis, right knee: Secondary | ICD-10-CM | POA: Diagnosis not present

## 2017-07-17 DIAGNOSIS — M5416 Radiculopathy, lumbar region: Secondary | ICD-10-CM | POA: Diagnosis not present

## 2017-07-22 DIAGNOSIS — R262 Difficulty in walking, not elsewhere classified: Secondary | ICD-10-CM | POA: Diagnosis not present

## 2017-07-22 DIAGNOSIS — Z950 Presence of cardiac pacemaker: Secondary | ICD-10-CM | POA: Diagnosis not present

## 2017-07-22 DIAGNOSIS — M545 Low back pain: Secondary | ICD-10-CM | POA: Diagnosis not present

## 2017-07-22 DIAGNOSIS — M5416 Radiculopathy, lumbar region: Secondary | ICD-10-CM | POA: Diagnosis not present

## 2017-07-22 DIAGNOSIS — M1711 Unilateral primary osteoarthritis, right knee: Secondary | ICD-10-CM | POA: Diagnosis not present

## 2017-07-23 ENCOUNTER — Other Ambulatory Visit: Payer: Self-pay | Admitting: Internal Medicine

## 2017-07-23 DIAGNOSIS — I480 Paroxysmal atrial fibrillation: Secondary | ICD-10-CM

## 2017-07-24 DIAGNOSIS — R262 Difficulty in walking, not elsewhere classified: Secondary | ICD-10-CM | POA: Diagnosis not present

## 2017-07-24 DIAGNOSIS — Z950 Presence of cardiac pacemaker: Secondary | ICD-10-CM | POA: Diagnosis not present

## 2017-07-24 DIAGNOSIS — M5416 Radiculopathy, lumbar region: Secondary | ICD-10-CM | POA: Diagnosis not present

## 2017-07-24 DIAGNOSIS — M545 Low back pain: Secondary | ICD-10-CM | POA: Diagnosis not present

## 2017-07-24 DIAGNOSIS — M1711 Unilateral primary osteoarthritis, right knee: Secondary | ICD-10-CM | POA: Diagnosis not present

## 2017-07-29 DIAGNOSIS — M5416 Radiculopathy, lumbar region: Secondary | ICD-10-CM | POA: Diagnosis not present

## 2017-07-29 DIAGNOSIS — M1711 Unilateral primary osteoarthritis, right knee: Secondary | ICD-10-CM | POA: Diagnosis not present

## 2017-07-29 DIAGNOSIS — M545 Low back pain: Secondary | ICD-10-CM | POA: Diagnosis not present

## 2017-07-29 DIAGNOSIS — R262 Difficulty in walking, not elsewhere classified: Secondary | ICD-10-CM | POA: Diagnosis not present

## 2017-07-29 DIAGNOSIS — Z950 Presence of cardiac pacemaker: Secondary | ICD-10-CM | POA: Diagnosis not present

## 2017-08-13 DIAGNOSIS — Z6833 Body mass index (BMI) 33.0-33.9, adult: Secondary | ICD-10-CM | POA: Diagnosis not present

## 2017-08-13 DIAGNOSIS — M5416 Radiculopathy, lumbar region: Secondary | ICD-10-CM | POA: Diagnosis not present

## 2017-08-18 DIAGNOSIS — M5416 Radiculopathy, lumbar region: Secondary | ICD-10-CM | POA: Diagnosis not present

## 2017-08-18 DIAGNOSIS — M48061 Spinal stenosis, lumbar region without neurogenic claudication: Secondary | ICD-10-CM | POA: Diagnosis not present

## 2017-08-18 DIAGNOSIS — M4807 Spinal stenosis, lumbosacral region: Secondary | ICD-10-CM | POA: Diagnosis not present

## 2017-08-18 DIAGNOSIS — M47816 Spondylosis without myelopathy or radiculopathy, lumbar region: Secondary | ICD-10-CM | POA: Diagnosis not present

## 2017-08-18 DIAGNOSIS — I700 Atherosclerosis of aorta: Secondary | ICD-10-CM | POA: Diagnosis not present

## 2017-10-28 DIAGNOSIS — M48062 Spinal stenosis, lumbar region with neurogenic claudication: Secondary | ICD-10-CM | POA: Diagnosis not present

## 2017-10-28 DIAGNOSIS — M4716 Other spondylosis with myelopathy, lumbar region: Secondary | ICD-10-CM | POA: Diagnosis not present

## 2017-10-28 DIAGNOSIS — M4316 Spondylolisthesis, lumbar region: Secondary | ICD-10-CM | POA: Diagnosis not present

## 2017-10-29 ENCOUNTER — Telehealth: Payer: Self-pay | Admitting: Internal Medicine

## 2017-10-29 NOTE — Telephone Encounter (Signed)
New message      [facility] Pre-operative Risk Assessment    Request for surgical clearance:  1. What type of surgery is being performed? 4 Level Lumbar Fusion   2. When is this surgery scheduled? TBD  3. What type of clearance is required (medical clearance vs. Pharmacy clearance to hold med vs. Both)? Both  4. Are there any medications that need to be held prior to surgery and how long? Eliquis, will leave up to cardiologist for the length of time withheld  5. Practice name and name of physician performing surgery? [facility], Dr. Kandis Nab  6. What is your office phone number [phone]   7.   What is your office fax number [phone]  8.   Anesthesia type (None, local, MAC, general) ? general   Trevor [name] 10/29/2017, 11:46 AM  _________________________________________________________________   (provider comments below)

## 2017-10-30 NOTE — Telephone Encounter (Signed)
Called the pt x4. Tried both telephone numbers listed.unable to leave a voicemail on either line.  Patient needs to be scheduled for a pre-op clearance appt.  Called the Surgeon's office (Dr.Roroy's) left a message on their voicemail to let them know that the pt will need to be seen by cardiology before clearance can be granted.

## 2017-10-30 NOTE — Telephone Encounter (Signed)
Pt takes Eliquis for afib with CHADS2VASc score of 5 (age x2, CAD, DM, HTN). Renal function normal (SCr 0.92 in KPN from 05/2017). Recommend holding Eliquis for 3 days prior to spinal procedure per protocol.

## 2017-10-30 NOTE — Telephone Encounter (Signed)
   Primary Cardiologist:Gregg Lovena Le, MD  Chart reviewed as part of pre-operative protocol coverage. Because of Trevor [name]'s past medical history and time since last visit, he/she will require a follow-up visit in order to better assess preoperative cardiovascular risk.  Pre-op covering staff: - Please schedule appointment and call patient to inform them. - Please contact requesting surgeon's office via preferred method (i.e, phone, fax) to inform them of need for appointment prior to surgery.  Tami Lin Duke, PA  10/30/2017, 2:29 PM

## 2017-11-03 ENCOUNTER — Encounter: Payer: Self-pay | Admitting: *Deleted

## 2017-11-03 NOTE — Progress Notes (Signed)
This encounter was created in error - please disregard.

## 2017-11-03 NOTE — Telephone Encounter (Signed)
Spoke with pt re: cardiac clearance. Pt has been scheduled to see Tommye Standard 11/05/17 @ 8:45  Pt has been advised to arrive at 8:30 for registration and pt has been made aware that it is located in the [place] office. Pt thanked me for the help.

## 2017-11-05 ENCOUNTER — Encounter: Payer: Medicare Other | Admitting: Physician Assistant

## 2017-11-06 ENCOUNTER — Encounter: Payer: Self-pay | Admitting: Physician Assistant

## 2017-11-06 ENCOUNTER — Ambulatory Visit (INDEPENDENT_AMBULATORY_CARE_PROVIDER_SITE_OTHER): Payer: Medicare Other | Admitting: Physician Assistant

## 2017-11-06 VITALS — BP 132/80 | HR 55 | Ht 70.0 in | Wt 232.0 lb

## 2017-11-06 DIAGNOSIS — Z95810 Presence of automatic (implantable) cardiac defibrillator: Secondary | ICD-10-CM | POA: Diagnosis not present

## 2017-11-06 DIAGNOSIS — I480 Paroxysmal atrial fibrillation: Secondary | ICD-10-CM | POA: Diagnosis not present

## 2017-11-06 DIAGNOSIS — I469 Cardiac arrest, cause unspecified: Secondary | ICD-10-CM | POA: Diagnosis not present

## 2017-11-06 DIAGNOSIS — I2510 Atherosclerotic heart disease of native coronary artery without angina pectoris: Secondary | ICD-10-CM

## 2017-11-06 DIAGNOSIS — I10 Essential (primary) hypertension: Secondary | ICD-10-CM

## 2017-11-06 DIAGNOSIS — Z01818 Encounter for other preprocedural examination: Secondary | ICD-10-CM | POA: Diagnosis not present

## 2017-11-06 NOTE — Patient Instructions (Signed)
Your physician recommends that you continue on your current medications as directed. Please refer to the Current Medication list given to you today.  Remote monitoring is used to monitor your Pacemaker of ICD from home. This monitoring reduces the number of office visits required to check your device to one time per year. It allows [place] to keep an eye on the functioning of your device to ensure it is working properly. You are scheduled for a device check from home on 8//28/19.  You may send your transmission at any time that day. If you have a wireless device, the transmission will be sent automatically. After your physician reviews your transmission, you will receive a postcard with your next transmission date.  Your physician wants you to follow-up in: 6 months with Dr. Tommye Standard, PA-C.  You will receive a reminder letter in the mail two months in advance. If you don't receive a letter, please call our office to schedule the follow-up appointment.
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
## 2017-11-07 NOTE — Addendum Note (Signed)
Addended by: Marlis Edelson C on: 11/07/2017 03:51 PM   Modules accepted: Orders

## 2017-11-12 ENCOUNTER — Encounter: Payer: Medicare Other | Admitting: *Deleted

## 2017-11-12 ENCOUNTER — Telehealth: Payer: Self-pay

## 2017-11-12 NOTE — Telephone Encounter (Signed)
Spoke with pt and reminded pt of remote transmission that is due today. Pt verbalized understanding.   

## 2017-11-12 NOTE — Telephone Encounter (Signed)
Pt wife called stating that they have called [employer] to help with the pt monitor. [employer] is sending them a T to help with the wifi. She also stated she would need to change the time for his transmission to be sent overnight. It will be 7-10 business days before pt receives his monitor.

## 2017-11-14 ENCOUNTER — Encounter: Payer: Self-pay | Admitting: Cardiology

## 2017-11-24 ENCOUNTER — Ambulatory Visit (INDEPENDENT_AMBULATORY_CARE_PROVIDER_SITE_OTHER): Payer: Medicare Other | Admitting: *Deleted

## 2017-11-24 DIAGNOSIS — I5022 Chronic systolic (congestive) heart failure: Secondary | ICD-10-CM

## 2017-11-24 NOTE — Progress Notes (Signed)
Remote ICD transmission.   

## 2017-11-25 ENCOUNTER — Encounter: Payer: Self-pay | Admitting: Cardiology

## 2017-12-16 ENCOUNTER — Telehealth: Payer: Self-pay | Admitting: Internal Medicine

## 2017-12-16 LAB — CUP PACEART REMOTE DEVICE CHECK
Battery Voltage: 2.98 V
Brady Statistic AP VP Percent: 0.06 %
Brady Statistic AP VS Percent: 30.48 %
Brady Statistic AS VP Percent: 0.05 %
Brady Statistic RA Percent Paced: 28.38 %
Brady Statistic RV Percent Paced: 0.1 %
HIGH POWER IMPEDANCE MEASURED VALUE: 80 Ohm
Implantable Lead Model: 6935
Implantable Pulse Generator Implant Date: 20151202
Lead Channel Impedance Value: 399 Ohm
Lead Channel Impedance Value: 437 Ohm
Lead Channel Pacing Threshold Amplitude: 0.5 V
Lead Channel Pacing Threshold Pulse Width: 0.4 ms
Lead Channel Sensing Intrinsic Amplitude: 2.375 mV
Lead Channel Sensing Intrinsic Amplitude: 8.5 mV
Lead Channel Sensing Intrinsic Amplitude: 8.5 mV
Lead Channel Setting Pacing Amplitude: 2 V
Lead Channel Setting Sensing Sensitivity: 0.3 mV
MDC IDC LEAD IMPLANT DT: 20151202
MDC IDC LEAD LOCATION: 753860
MDC IDC MSMT BATTERY REMAINING LONGEVITY: 74 mo
MDC IDC MSMT LEADCHNL RA PACING THRESHOLD AMPLITUDE: 0.5 V
MDC IDC MSMT LEADCHNL RA PACING THRESHOLD PULSEWIDTH: 0.4 ms
MDC IDC MSMT LEADCHNL RA SENSING INTR AMPL: 2.375 mV
MDC IDC MSMT LEADCHNL RV IMPEDANCE VALUE: 437 Ohm
MDC IDC SESS DTM: 20190909062604
MDC IDC SET LEADCHNL RA PACING AMPLITUDE: 1.5 V
MDC IDC SET LEADCHNL RV PACING PULSEWIDTH: 0.4 ms
MDC IDC STAT BRADY AS VS PERCENT: 69.41 %

## 2017-12-16 NOTE — Telephone Encounter (Signed)
   Primary Cardiologist: Cristopher Peru, MD/ Dr. Marlou Porch  Chart reviewed as part of pre-operative protocol coverage. The patient was seen by Tommye Standard, [facility] 11/06/17 and cleared for surgery as below:  "I reviewed the case with Dr. Marlou Porch Given orthopedic issues difficult to get a sense of what his METS are outside of this He has had a cath/echo within the last 5 years with known and stable CAD No symptoms of angina with routine daily activities Stress testing would certainly give defect in the territory of his occluded RCA Would not pursue pre-op cardiac testing He is a moderate cardiac risk for surgery He will need standard peri-operative device management It is OK from [place] to hold his eliquis 3 days prior to surgery to resume post-operatively as per surgeon".  I will route this recommendation to the requesting party via Epic fax function and remove from pre-op pool.  Please call with questions.  [place], [place] 12/16/2017, 1:48 PM

## 2017-12-16 NOTE — Telephone Encounter (Signed)
Follow Up:    Calling to check on the status of pt's clearance. She needs this asap please, pt's surgery is pending. Please fax asap to [phone] [id number]

## 2018-01-07 ENCOUNTER — Telehealth: Payer: Self-pay | Admitting: Internal Medicine

## 2018-01-07 NOTE — Telephone Encounter (Signed)
2nd voicemail for Trevor [name] to call device clinic

## 2018-01-07 NOTE — Telephone Encounter (Signed)
New Message    Trevor [name] with [employer] is calling on behalf of patient. Patient is scheduled to see Dr. Lovena Le in [place] on 11/6. On 10/30 the patient is having surgery after that they are fitting him with a Bone Growth Stimulator. Trevor [name] has is wanting to know if [employer] is going to be at the appointment. So that she can come as well with the Bone Growth Stimulator to put on the patient and to test to make sure that there is no type of interference. Please call to discuss.

## 2018-01-07 NOTE — Telephone Encounter (Signed)
LVM for stephanie to call device clinic back direct number given.

## 2018-01-07 NOTE — Telephone Encounter (Signed)
Spoke with stephanie informed her that it  Would be ok for her to be present with bone stimulator at 11/6 apt to make sure there is no interference with pts ICD

## 2018-01-13 DIAGNOSIS — R918 Other nonspecific abnormal finding of lung field: Secondary | ICD-10-CM | POA: Diagnosis not present

## 2018-01-15 DIAGNOSIS — M109 Gout, unspecified: Secondary | ICD-10-CM | POA: Diagnosis present

## 2018-01-15 DIAGNOSIS — M4316 Spondylolisthesis, lumbar region: Secondary | ICD-10-CM | POA: Diagnosis present

## 2018-01-15 DIAGNOSIS — Z7901 Long term (current) use of anticoagulants: Secondary | ICD-10-CM | POA: Diagnosis not present

## 2018-01-15 DIAGNOSIS — E119 Type 2 diabetes mellitus without complications: Secondary | ICD-10-CM | POA: Diagnosis present

## 2018-01-15 DIAGNOSIS — M170 Bilateral primary osteoarthritis of knee: Secondary | ICD-10-CM | POA: Diagnosis present

## 2018-01-15 DIAGNOSIS — Z794 Long term (current) use of insulin: Secondary | ICD-10-CM | POA: Diagnosis not present

## 2018-01-15 DIAGNOSIS — I4891 Unspecified atrial fibrillation: Secondary | ICD-10-CM | POA: Diagnosis not present

## 2018-01-15 DIAGNOSIS — Z4789 Encounter for other orthopedic aftercare: Secondary | ICD-10-CM | POA: Diagnosis not present

## 2018-01-15 DIAGNOSIS — D62 Acute posthemorrhagic anemia: Secondary | ICD-10-CM | POA: Diagnosis not present

## 2018-01-15 DIAGNOSIS — E7800 Pure hypercholesterolemia, unspecified: Secondary | ICD-10-CM | POA: Diagnosis present

## 2018-01-15 DIAGNOSIS — M48061 Spinal stenosis, lumbar region without neurogenic claudication: Secondary | ICD-10-CM | POA: Diagnosis not present

## 2018-01-15 DIAGNOSIS — Z79899 Other long term (current) drug therapy: Secondary | ICD-10-CM | POA: Diagnosis not present

## 2018-01-15 DIAGNOSIS — M48062 Spinal stenosis, lumbar region with neurogenic claudication: Secondary | ICD-10-CM | POA: Diagnosis not present

## 2018-01-15 DIAGNOSIS — M4716 Other spondylosis with myelopathy, lumbar region: Secondary | ICD-10-CM | POA: Diagnosis not present

## 2018-01-15 DIAGNOSIS — Z6832 Body mass index (BMI) 32.0-32.9, adult: Secondary | ICD-10-CM | POA: Diagnosis not present

## 2018-01-15 DIAGNOSIS — Z95810 Presence of automatic (implantable) cardiac defibrillator: Secondary | ICD-10-CM | POA: Diagnosis not present

## 2018-01-15 DIAGNOSIS — M4306 Spondylolysis, lumbar region: Secondary | ICD-10-CM | POA: Diagnosis not present

## 2018-01-15 DIAGNOSIS — E1165 Type 2 diabetes mellitus with hyperglycemia: Secondary | ICD-10-CM | POA: Diagnosis not present

## 2018-01-15 DIAGNOSIS — M9973 Connective tissue and disc stenosis of intervertebral foramina of lumbar region: Secondary | ICD-10-CM | POA: Diagnosis not present

## 2018-01-15 DIAGNOSIS — I10 Essential (primary) hypertension: Secondary | ICD-10-CM | POA: Diagnosis present

## 2018-01-15 DIAGNOSIS — M5106 Intervertebral disc disorders with myelopathy, lumbar region: Secondary | ICD-10-CM | POA: Diagnosis not present

## 2018-01-15 DIAGNOSIS — M4327 Fusion of spine, lumbosacral region: Secondary | ICD-10-CM | POA: Diagnosis not present

## 2018-01-15 DIAGNOSIS — Z86718 Personal history of other venous thrombosis and embolism: Secondary | ICD-10-CM | POA: Diagnosis not present

## 2018-01-15 DIAGNOSIS — Z87891 Personal history of nicotine dependence: Secondary | ICD-10-CM | POA: Diagnosis not present

## 2018-01-20 DIAGNOSIS — E119 Type 2 diabetes mellitus without complications: Secondary | ICD-10-CM | POA: Diagnosis not present

## 2018-01-20 DIAGNOSIS — M9973 Connective tissue and disc stenosis of intervertebral foramina of lumbar region: Secondary | ICD-10-CM | POA: Diagnosis not present

## 2018-01-20 DIAGNOSIS — M48062 Spinal stenosis, lumbar region with neurogenic claudication: Secondary | ICD-10-CM | POA: Diagnosis not present

## 2018-01-20 DIAGNOSIS — E1165 Type 2 diabetes mellitus with hyperglycemia: Secondary | ICD-10-CM | POA: Diagnosis not present

## 2018-01-20 DIAGNOSIS — M5106 Intervertebral disc disorders with myelopathy, lumbar region: Secondary | ICD-10-CM | POA: Diagnosis not present

## 2018-01-20 DIAGNOSIS — M109 Gout, unspecified: Secondary | ICD-10-CM | POA: Diagnosis not present

## 2018-01-20 DIAGNOSIS — Z4789 Encounter for other orthopedic aftercare: Secondary | ICD-10-CM | POA: Diagnosis not present

## 2018-01-20 DIAGNOSIS — M4306 Spondylolysis, lumbar region: Secondary | ICD-10-CM | POA: Diagnosis not present

## 2018-01-21 ENCOUNTER — Encounter: Payer: Medicare Other | Admitting: Internal Medicine

## 2018-01-24 DIAGNOSIS — E1165 Type 2 diabetes mellitus with hyperglycemia: Secondary | ICD-10-CM | POA: Diagnosis not present

## 2018-01-24 DIAGNOSIS — M48062 Spinal stenosis, lumbar region with neurogenic claudication: Secondary | ICD-10-CM | POA: Diagnosis not present

## 2018-01-27 ENCOUNTER — Other Ambulatory Visit: Payer: Self-pay | Admitting: Internal Medicine

## 2018-02-11 DIAGNOSIS — M47816 Spondylosis without myelopathy or radiculopathy, lumbar region: Secondary | ICD-10-CM | POA: Diagnosis not present

## 2018-02-18 ENCOUNTER — Encounter: Payer: Self-pay | Admitting: *Deleted

## 2018-02-19 ENCOUNTER — Encounter: Payer: Self-pay | Admitting: Internal Medicine

## 2018-02-19 ENCOUNTER — Ambulatory Visit (INDEPENDENT_AMBULATORY_CARE_PROVIDER_SITE_OTHER): Payer: Medicare Other | Admitting: Internal Medicine

## 2018-02-19 VITALS — BP 132/84 | HR 88 | Ht 70.0 in | Wt 227.2 lb

## 2018-02-19 DIAGNOSIS — Z95810 Presence of automatic (implantable) cardiac defibrillator: Secondary | ICD-10-CM | POA: Diagnosis not present

## 2018-02-19 DIAGNOSIS — I5022 Chronic systolic (congestive) heart failure: Secondary | ICD-10-CM | POA: Diagnosis not present

## 2018-02-19 DIAGNOSIS — I4901 Ventricular fibrillation: Secondary | ICD-10-CM | POA: Diagnosis not present

## 2018-02-19 DIAGNOSIS — I2510 Atherosclerotic heart disease of native coronary artery without angina pectoris: Secondary | ICD-10-CM

## 2018-02-19 LAB — CUP PACEART INCLINIC DEVICE CHECK
Battery Remaining Longevity: 66 mo
Brady Statistic AP VP Percent: 0.1 % — CL
HighPow Impedance: 71 Ohm
Implantable Lead Implant Date: 20151202
Implantable Lead Location: 753859
Implantable Lead Location: 753860
Implantable Lead Model: 6935
Implantable Pulse Generator Implant Date: 20151202
Lead Channel Impedance Value: 399 Ohm
Lead Channel Pacing Threshold Amplitude: 0.75 V
Lead Channel Pacing Threshold Pulse Width: 0.4 ms
Lead Channel Setting Pacing Amplitude: 1.5 V
Lead Channel Setting Pacing Amplitude: 2 V
Lead Channel Setting Pacing Pulse Width: 0.4 ms
Lead Channel Setting Sensing Sensitivity: 0.3 mV
MDC IDC LEAD IMPLANT DT: 20151202
MDC IDC MSMT LEADCHNL RA PACING THRESHOLD AMPLITUDE: 0.5 V
MDC IDC MSMT LEADCHNL RA PACING THRESHOLD PULSEWIDTH: 0.4 ms
MDC IDC MSMT LEADCHNL RA SENSING INTR AMPL: 2.6 mV
MDC IDC MSMT LEADCHNL RV IMPEDANCE VALUE: 437 Ohm
MDC IDC MSMT LEADCHNL RV SENSING INTR AMPL: 8.8 mV
MDC IDC SESS DTM: 20191205093259
MDC IDC STAT BRADY AP VS PERCENT: 33.3 %
MDC IDC STAT BRADY AS VP PERCENT: 0.1 % — AB
MDC IDC STAT BRADY AS VS PERCENT: 66.6 %

## 2018-02-19 NOTE — Patient Instructions (Signed)
Medication Instructions:  Your physician recommends that you continue on your current medications as directed. Please refer to the Current Medication list given to you today.  If you need a refill on your cardiac medications before your next appointment, please call your pharmacy.   Lab work: none If you have labs (blood work) drawn today and your tests are completely normal, you will receive your results only by: Marland Kitchen MyChart Message (if you have MyChart) OR . A paper copy in the mail If you have any lab test that is abnormal or we need to change your treatment, we will call you to review the results.  Testing/Procedures: none  Follow-Up: At [facility], you and your health needs are our priority.  As part of our continuing mission to provide you with exceptional heart care, we have created designated Provider Care Teams.  These Care Teams include your primary Cardiologist (physician) and Advanced Practice Providers (APPs -  Physician Assistants and Nurse Practitioners) who all work together to provide you with the care you need, when you need it. You will need a follow up appointment in 1 years.  Please call our office 2 months in advance to schedule this appointment.  You may see Cristopher Peru, MD or one of the following Advanced Practice Providers on your designated Care Team:   Bernerd Pho, PA-C [facility]) . Ermalinda Barrios, PA-C ([place])  Any Other Special Instructions Will Be Listed Below (If Applicable). None

## 2018-02-19 NOTE — Progress Notes (Signed)
HPI Mr. Manka returns today for followup. He is a pleasant 77 yo man with a h/o CAD, LV dysfunction who sustained a VF arrest a couple of years ago. He underwent catheterization and was found to have a chronically occluded RCA. He has known atrial fib documented on his ICD. He underwent ICD implant and returns today for followup. In the interim he has done well with no chest pain. He saw Tommye Standard, PA-C a couple of months ago for preoperative clearance. He underwent spine surgery several weeks ago. He feels well although he had a gout flare up after his surgery.  No Known Allergies   Current Outpatient Medications  Medication Sig Dispense Refill  . allopurinol (ZYLOPRIM) 100 MG tablet Take 100 mg by mouth daily.     Marland Kitchen atorvastatin (LIPITOR) 20 MG tablet Take 20 mg by mouth once a week.  3  . ELIQUIS 5 MG TABS tablet TAKE ONE TABLET BY MOUTH TWICE DAILY 180 tablet 3  . hydrochlorothiazide (HYDRODIURIL) 25 MG tablet Take 25 mg by mouth daily.    . insulin NPH-regular Human (NOVOLIN 70/30) (70-30) 100 UNIT/ML injection Inject 80 Units into the skin.    Marland Kitchen lisinopril (PRINIVIL,ZESTRIL) 40 MG tablet Take 40 mg by mouth daily.    . metFORMIN (GLUCOPHAGE) 1000 MG tablet Take 1,000 mg by mouth 2 (two) times daily with a meal.    . metoprolol tartrate (LOPRESSOR) 25 MG tablet Take 1 tablet (25 mg total) by mouth 2 (two) times daily. 60 tablet 9  . traMADol (ULTRAM) 50 MG tablet Take 50 mg by mouth 3 (three) times daily as needed. for pain  2   No current facility-administered medications for this visit.      Past Medical History:  Diagnosis Date  . CAD (coronary artery disease)    a. RCA PCI in 2003 b. 2008-RCA was occluded; residual CAD- 70% CFX and 50% LAD with normal LVF c. low risk Myoview in Dec 2011  . Diabetes mellitus ([place])   . Dyslipidemia   . GERD (gastroesophageal reflux disease)   . HTN (hypertension)   . Ischemic cardiomyopathy    a. s/p MDT dual chamber ICD 02-2014  .  Osteoarthritis     ROS:   All systems reviewed and negative except as noted in the HPI.   Past Surgical History:  Procedure Laterality Date  . CARDIAC CATHETERIZATION  2008   occluded RCA- med Rx  . CATARACT EXTRACTION Right July 2011  . CERVICAL SPINE SURGERY  Feb 2010  . CORONARY ANGIOPLASTY  2003   RCA PCI  . IMPLANTABLE CARDIOVERTER DEFIBRILLATOR IMPLANT  02-16-2014   MDT Ever[place] dual chamber ICD implanted by Dr Lovena Le  . IMPLANTABLE CARDIOVERTER DEFIBRILLATOR IMPLANT N/A 02/16/2014   Procedure: IMPLANTABLE CARDIOVERTER DEFIBRILLATOR IMPLANT;  Surgeon: Evans Lance, MD;  Location: [facility] CATH LAB;  Service: Cardiovascular;  Laterality: N/A;  . LEFT HEART CATHETERIZATION WITH CORONARY ANGIOGRAM N/A 01/05/2014   Procedure: LEFT HEART CATHETERIZATION WITH CORONARY ANGIOGRAM;  Surgeon: Wellington Hampshire, MD;  Location: [facility] CATH LAB;  Service: Cardiovascular;  Laterality: N/A;     Family History  Problem Relation Age of Onset  . Diabetes Unknown      Social History   Socioeconomic History  . Marital status: Married    Spouse name: Not on file  . Number of children: Not on file  . Years of education: Not on file  . Highest education level: Not on file  Occupational History  .  Not on file  Social Needs  . Financial resource strain: Not on file  . Food insecurity:    Worry: Not on file    Inability: Not on file  . Transportation needs:    Medical: Not on file    Non-medical: Not on file  Tobacco Use  . Smoking status: Former Smoker    Last attempt to quit: 03/25/1983    Years since quitting: 34.9  . Smokeless tobacco: Former Systems developer    Quit date: 01/04/1983  Substance and Sexual Activity  . Alcohol use: Yes    Comment: ONCE IN A WHILE   . Drug use: No  . Sexual activity: Yes  Lifestyle  . Physical activity:    Days per week: Not on file    Minutes per session: Not on file  . Stress: Not on file  Relationships  . Social connections:    Talks on phone: Not on file     Gets together: Not on file    Attends religious service: Not on file    Active member of club or organization: Not on file    Attends meetings of clubs or organizations: Not on file    Relationship status: Not on file  . Intimate partner violence:    Fear of current or ex partner: Not on file    Emotionally abused: Not on file    Physically abused: Not on file    Forced sexual activity: Not on file  Other Topics Concern  . Not on file  Social History Narrative  . Not on file     Ht 5\' 10"  (1.778 m)   BMI 33.29 kg/m   Physical Exam:  Well appearing NAD HEENT: Unremarkable Neck:  No JVD, no thyromegally Lymphatics:  No adenopathy Back:  No CVA tenderness Lungs:  Clear HEART:  Regular rate rhythm, no murmurs, no rubs, no clicks Abd:  soft, positive bowel sounds, no organomegally, no rebound, no guarding Ext:  2 plus pulses, no edema, no cyanosis, no clubbing Skin:  No rashes no nodules Neuro:  CN II through XII intact, motor grossly intact   DEVICE  Normal device function.  See PaceArt for details.   Assess/Plan: 1. VF - he is s/p ICD and has had no ICD shocks in the interim.  2. CAD - he is s/p remote MI and has an occluded RCA. He denies anginal symptoms.  3. PAF - he is asymptomatic and is tolerating systemic anti-coagulation with no bleeding 4. ICD - intrerogation of his medtronic DDD ICD demonstrates normal device function.   Mikle Bosworth.D.

## 2018-02-20 DIAGNOSIS — M109 Gout, unspecified: Secondary | ICD-10-CM | POA: Diagnosis not present

## 2018-02-20 DIAGNOSIS — Z9189 Other specified personal risk factors, not elsewhere classified: Secondary | ICD-10-CM | POA: Diagnosis not present

## 2018-02-20 DIAGNOSIS — K210 Gastro-esophageal reflux disease with esophagitis: Secondary | ICD-10-CM | POA: Diagnosis not present

## 2018-02-20 DIAGNOSIS — E1142 Type 2 diabetes mellitus with diabetic polyneuropathy: Secondary | ICD-10-CM | POA: Diagnosis not present

## 2018-02-20 DIAGNOSIS — E782 Mixed hyperlipidemia: Secondary | ICD-10-CM | POA: Diagnosis not present

## 2018-02-20 DIAGNOSIS — I10 Essential (primary) hypertension: Secondary | ICD-10-CM | POA: Diagnosis not present

## 2018-02-23 ENCOUNTER — Ambulatory Visit (INDEPENDENT_AMBULATORY_CARE_PROVIDER_SITE_OTHER): Payer: Medicare Other

## 2018-02-23 DIAGNOSIS — I5022 Chronic systolic (congestive) heart failure: Secondary | ICD-10-CM

## 2018-02-23 DIAGNOSIS — I4901 Ventricular fibrillation: Secondary | ICD-10-CM

## 2018-02-25 NOTE — Progress Notes (Signed)
Remote ICD transmission.   

## 2018-02-26 DIAGNOSIS — I10 Essential (primary) hypertension: Secondary | ICD-10-CM | POA: Diagnosis not present

## 2018-02-26 DIAGNOSIS — Z6832 Body mass index (BMI) 32.0-32.9, adult: Secondary | ICD-10-CM | POA: Diagnosis not present

## 2018-02-26 DIAGNOSIS — Z0001 Encounter for general adult medical examination with abnormal findings: Secondary | ICD-10-CM | POA: Diagnosis not present

## 2018-03-09 DIAGNOSIS — Z6831 Body mass index (BMI) 31.0-31.9, adult: Secondary | ICD-10-CM | POA: Diagnosis not present

## 2018-03-09 DIAGNOSIS — M545 Low back pain: Secondary | ICD-10-CM | POA: Diagnosis not present

## 2018-03-09 DIAGNOSIS — J329 Chronic sinusitis, unspecified: Secondary | ICD-10-CM | POA: Diagnosis not present

## 2018-04-09 LAB — CUP PACEART REMOTE DEVICE CHECK
Battery Remaining Longevity: 66 mo
Brady Statistic AP VP Percent: 0.04 %
Brady Statistic RA Percent Paced: 21.89 %
Date Time Interrogation Session: 20191209190526
HIGH POWER IMPEDANCE MEASURED VALUE: 74 Ohm
Implantable Lead Implant Date: 20151202
Implantable Lead Location: 753860
Implantable Lead Model: 5076
Implantable Lead Model: 6935
Lead Channel Impedance Value: 380 Ohm
Lead Channel Impedance Value: 399 Ohm
Lead Channel Pacing Threshold Pulse Width: 0.4 ms
Lead Channel Sensing Intrinsic Amplitude: 2.75 mV
Lead Channel Sensing Intrinsic Amplitude: 8.375 mV
Lead Channel Setting Pacing Amplitude: 1.5 V
Lead Channel Setting Pacing Pulse Width: 0.4 ms
MDC IDC LEAD IMPLANT DT: 20151202
MDC IDC LEAD LOCATION: 753859
MDC IDC MSMT BATTERY VOLTAGE: 2.96 V
MDC IDC MSMT LEADCHNL RA PACING THRESHOLD AMPLITUDE: 0.625 V
MDC IDC MSMT LEADCHNL RA SENSING INTR AMPL: 2.75 mV
MDC IDC MSMT LEADCHNL RV IMPEDANCE VALUE: 380 Ohm
MDC IDC MSMT LEADCHNL RV PACING THRESHOLD AMPLITUDE: 0.625 V
MDC IDC MSMT LEADCHNL RV PACING THRESHOLD PULSEWIDTH: 0.4 ms
MDC IDC MSMT LEADCHNL RV SENSING INTR AMPL: 8.375 mV
MDC IDC PG IMPLANT DT: 20151202
MDC IDC SET LEADCHNL RV PACING AMPLITUDE: 2 V
MDC IDC SET LEADCHNL RV SENSING SENSITIVITY: 0.3 mV
MDC IDC STAT BRADY AP VS PERCENT: 23.13 %
MDC IDC STAT BRADY AS VP PERCENT: 0.06 %
MDC IDC STAT BRADY AS VS PERCENT: 76.77 %
MDC IDC STAT BRADY RV PERCENT PACED: 0.09 %

## 2018-04-15 DIAGNOSIS — M4327 Fusion of spine, lumbosacral region: Secondary | ICD-10-CM | POA: Diagnosis not present

## 2018-04-15 DIAGNOSIS — Z981 Arthrodesis status: Secondary | ICD-10-CM | POA: Diagnosis not present

## 2018-04-15 DIAGNOSIS — M4716 Other spondylosis with myelopathy, lumbar region: Secondary | ICD-10-CM | POA: Diagnosis not present

## 2018-04-15 DIAGNOSIS — M1711 Unilateral primary osteoarthritis, right knee: Secondary | ICD-10-CM | POA: Diagnosis not present

## 2018-04-15 DIAGNOSIS — M259 Joint disorder, unspecified: Secondary | ICD-10-CM | POA: Diagnosis not present

## 2018-04-23 DIAGNOSIS — M48062 Spinal stenosis, lumbar region with neurogenic claudication: Secondary | ICD-10-CM | POA: Diagnosis not present

## 2018-04-23 DIAGNOSIS — M4316 Spondylolisthesis, lumbar region: Secondary | ICD-10-CM | POA: Diagnosis not present

## 2018-04-23 DIAGNOSIS — M4716 Other spondylosis with myelopathy, lumbar region: Secondary | ICD-10-CM | POA: Diagnosis not present

## 2018-04-23 DIAGNOSIS — M259 Joint disorder, unspecified: Secondary | ICD-10-CM | POA: Diagnosis not present

## 2018-05-04 DIAGNOSIS — M5410 Radiculopathy, site unspecified: Secondary | ICD-10-CM | POA: Diagnosis not present

## 2018-05-04 DIAGNOSIS — M25561 Pain in right knee: Secondary | ICD-10-CM | POA: Diagnosis not present

## 2018-05-04 DIAGNOSIS — R29898 Other symptoms and signs involving the musculoskeletal system: Secondary | ICD-10-CM | POA: Diagnosis not present

## 2018-05-04 DIAGNOSIS — M170 Bilateral primary osteoarthritis of knee: Secondary | ICD-10-CM | POA: Diagnosis not present

## 2018-05-04 DIAGNOSIS — G8929 Other chronic pain: Secondary | ICD-10-CM | POA: Diagnosis not present

## 2018-05-05 DIAGNOSIS — M5410 Radiculopathy, site unspecified: Secondary | ICD-10-CM | POA: Diagnosis not present

## 2018-05-05 DIAGNOSIS — M25561 Pain in right knee: Secondary | ICD-10-CM | POA: Diagnosis not present

## 2018-05-05 DIAGNOSIS — M170 Bilateral primary osteoarthritis of knee: Secondary | ICD-10-CM | POA: Diagnosis not present

## 2018-05-05 DIAGNOSIS — R29898 Other symptoms and signs involving the musculoskeletal system: Secondary | ICD-10-CM | POA: Diagnosis not present

## 2018-05-07 DIAGNOSIS — R29898 Other symptoms and signs involving the musculoskeletal system: Secondary | ICD-10-CM | POA: Diagnosis not present

## 2018-05-07 DIAGNOSIS — M5410 Radiculopathy, site unspecified: Secondary | ICD-10-CM | POA: Diagnosis not present

## 2018-05-07 DIAGNOSIS — M170 Bilateral primary osteoarthritis of knee: Secondary | ICD-10-CM | POA: Diagnosis not present

## 2018-05-07 DIAGNOSIS — M25561 Pain in right knee: Secondary | ICD-10-CM | POA: Diagnosis not present

## 2018-05-11 DIAGNOSIS — R29898 Other symptoms and signs involving the musculoskeletal system: Secondary | ICD-10-CM | POA: Diagnosis not present

## 2018-05-11 DIAGNOSIS — M5410 Radiculopathy, site unspecified: Secondary | ICD-10-CM | POA: Diagnosis not present

## 2018-05-11 DIAGNOSIS — M25561 Pain in right knee: Secondary | ICD-10-CM | POA: Diagnosis not present

## 2018-05-11 DIAGNOSIS — M170 Bilateral primary osteoarthritis of knee: Secondary | ICD-10-CM | POA: Diagnosis not present

## 2018-05-14 DIAGNOSIS — M25561 Pain in right knee: Secondary | ICD-10-CM | POA: Diagnosis not present

## 2018-05-14 DIAGNOSIS — M170 Bilateral primary osteoarthritis of knee: Secondary | ICD-10-CM | POA: Diagnosis not present

## 2018-05-14 DIAGNOSIS — R29898 Other symptoms and signs involving the musculoskeletal system: Secondary | ICD-10-CM | POA: Diagnosis not present

## 2018-05-14 DIAGNOSIS — M5410 Radiculopathy, site unspecified: Secondary | ICD-10-CM | POA: Diagnosis not present

## 2018-05-18 DIAGNOSIS — M170 Bilateral primary osteoarthritis of knee: Secondary | ICD-10-CM | POA: Diagnosis not present

## 2018-05-18 DIAGNOSIS — R29898 Other symptoms and signs involving the musculoskeletal system: Secondary | ICD-10-CM | POA: Diagnosis not present

## 2018-05-18 DIAGNOSIS — M5410 Radiculopathy, site unspecified: Secondary | ICD-10-CM | POA: Diagnosis not present

## 2018-05-18 DIAGNOSIS — M25561 Pain in right knee: Secondary | ICD-10-CM | POA: Diagnosis not present

## 2018-05-21 DIAGNOSIS — M170 Bilateral primary osteoarthritis of knee: Secondary | ICD-10-CM | POA: Diagnosis not present

## 2018-05-21 DIAGNOSIS — M5410 Radiculopathy, site unspecified: Secondary | ICD-10-CM | POA: Diagnosis not present

## 2018-05-21 DIAGNOSIS — M25561 Pain in right knee: Secondary | ICD-10-CM | POA: Diagnosis not present

## 2018-05-21 DIAGNOSIS — R29898 Other symptoms and signs involving the musculoskeletal system: Secondary | ICD-10-CM | POA: Diagnosis not present

## 2018-05-25 ENCOUNTER — Ambulatory Visit (INDEPENDENT_AMBULATORY_CARE_PROVIDER_SITE_OTHER): Payer: Medicare Other | Admitting: *Deleted

## 2018-05-25 DIAGNOSIS — M170 Bilateral primary osteoarthritis of knee: Secondary | ICD-10-CM | POA: Diagnosis not present

## 2018-05-25 DIAGNOSIS — M25561 Pain in right knee: Secondary | ICD-10-CM | POA: Diagnosis not present

## 2018-05-25 DIAGNOSIS — I4901 Ventricular fibrillation: Secondary | ICD-10-CM | POA: Diagnosis not present

## 2018-05-25 DIAGNOSIS — I5022 Chronic systolic (congestive) heart failure: Secondary | ICD-10-CM

## 2018-05-25 DIAGNOSIS — R29898 Other symptoms and signs involving the musculoskeletal system: Secondary | ICD-10-CM | POA: Diagnosis not present

## 2018-05-25 DIAGNOSIS — M5410 Radiculopathy, site unspecified: Secondary | ICD-10-CM | POA: Diagnosis not present

## 2018-05-26 LAB — CUP PACEART REMOTE DEVICE CHECK
Battery Remaining Longevity: 63 mo
Battery Voltage: 2.98 V
Brady Statistic AS VP Percent: 0.05 %
Brady Statistic RA Percent Paced: 8.3 %
HighPow Impedance: 74 Ohm
Implantable Lead Location: 753859
Implantable Lead Location: 753860
Implantable Lead Model: 5076
Implantable Lead Model: 6935
Implantable Pulse Generator Implant Date: 20151202
Lead Channel Impedance Value: 399 Ohm
Lead Channel Pacing Threshold Amplitude: 0.5 V
Lead Channel Pacing Threshold Pulse Width: 0.4 ms
Lead Channel Sensing Intrinsic Amplitude: 2.5 mV
Lead Channel Sensing Intrinsic Amplitude: 2.5 mV
Lead Channel Sensing Intrinsic Amplitude: 7.75 mV
MDC IDC LEAD IMPLANT DT: 20151202
MDC IDC LEAD IMPLANT DT: 20151202
MDC IDC MSMT LEADCHNL RA IMPEDANCE VALUE: 399 Ohm
MDC IDC MSMT LEADCHNL RV IMPEDANCE VALUE: 380 Ohm
MDC IDC MSMT LEADCHNL RV PACING THRESHOLD AMPLITUDE: 0.75 V
MDC IDC MSMT LEADCHNL RV PACING THRESHOLD PULSEWIDTH: 0.4 ms
MDC IDC MSMT LEADCHNL RV SENSING INTR AMPL: 7.75 mV
MDC IDC SESS DTM: 20200309203512
MDC IDC SET LEADCHNL RA PACING AMPLITUDE: 1.5 V
MDC IDC SET LEADCHNL RV PACING AMPLITUDE: 2 V
MDC IDC SET LEADCHNL RV PACING PULSEWIDTH: 0.4 ms
MDC IDC SET LEADCHNL RV SENSING SENSITIVITY: 0.3 mV
MDC IDC STAT BRADY AP VP PERCENT: 0.02 %
MDC IDC STAT BRADY AP VS PERCENT: 8.43 %
MDC IDC STAT BRADY AS VS PERCENT: 91.5 %
MDC IDC STAT BRADY RV PERCENT PACED: 0.06 %

## 2018-05-28 DIAGNOSIS — M5410 Radiculopathy, site unspecified: Secondary | ICD-10-CM | POA: Diagnosis not present

## 2018-05-28 DIAGNOSIS — M25561 Pain in right knee: Secondary | ICD-10-CM | POA: Diagnosis not present

## 2018-05-28 DIAGNOSIS — M170 Bilateral primary osteoarthritis of knee: Secondary | ICD-10-CM | POA: Diagnosis not present

## 2018-05-28 DIAGNOSIS — R29898 Other symptoms and signs involving the musculoskeletal system: Secondary | ICD-10-CM | POA: Diagnosis not present

## 2018-05-29 DIAGNOSIS — M259 Joint disorder, unspecified: Secondary | ICD-10-CM | POA: Diagnosis not present

## 2018-05-29 DIAGNOSIS — M48062 Spinal stenosis, lumbar region with neurogenic claudication: Secondary | ICD-10-CM | POA: Diagnosis not present

## 2018-05-29 DIAGNOSIS — M4716 Other spondylosis with myelopathy, lumbar region: Secondary | ICD-10-CM | POA: Diagnosis not present

## 2018-06-01 DIAGNOSIS — M25561 Pain in right knee: Secondary | ICD-10-CM | POA: Diagnosis not present

## 2018-06-01 DIAGNOSIS — R29898 Other symptoms and signs involving the musculoskeletal system: Secondary | ICD-10-CM | POA: Diagnosis not present

## 2018-06-01 DIAGNOSIS — M5410 Radiculopathy, site unspecified: Secondary | ICD-10-CM | POA: Diagnosis not present

## 2018-06-01 DIAGNOSIS — M170 Bilateral primary osteoarthritis of knee: Secondary | ICD-10-CM | POA: Diagnosis not present

## 2018-06-02 NOTE — Progress Notes (Signed)
Remote ICD transmission.   

## 2018-06-04 DIAGNOSIS — M170 Bilateral primary osteoarthritis of knee: Secondary | ICD-10-CM | POA: Diagnosis not present

## 2018-06-04 DIAGNOSIS — R29898 Other symptoms and signs involving the musculoskeletal system: Secondary | ICD-10-CM | POA: Diagnosis not present

## 2018-06-04 DIAGNOSIS — M25561 Pain in right knee: Secondary | ICD-10-CM | POA: Diagnosis not present

## 2018-06-04 DIAGNOSIS — M5410 Radiculopathy, site unspecified: Secondary | ICD-10-CM | POA: Diagnosis not present

## 2018-06-08 DIAGNOSIS — M170 Bilateral primary osteoarthritis of knee: Secondary | ICD-10-CM | POA: Diagnosis not present

## 2018-06-08 DIAGNOSIS — M25561 Pain in right knee: Secondary | ICD-10-CM | POA: Diagnosis not present

## 2018-06-08 DIAGNOSIS — M5410 Radiculopathy, site unspecified: Secondary | ICD-10-CM | POA: Diagnosis not present

## 2018-06-08 DIAGNOSIS — R29898 Other symptoms and signs involving the musculoskeletal system: Secondary | ICD-10-CM | POA: Diagnosis not present

## 2018-06-11 DIAGNOSIS — M170 Bilateral primary osteoarthritis of knee: Secondary | ICD-10-CM | POA: Diagnosis not present

## 2018-06-11 DIAGNOSIS — R29898 Other symptoms and signs involving the musculoskeletal system: Secondary | ICD-10-CM | POA: Diagnosis not present

## 2018-06-11 DIAGNOSIS — M5410 Radiculopathy, site unspecified: Secondary | ICD-10-CM | POA: Diagnosis not present

## 2018-06-11 DIAGNOSIS — M25561 Pain in right knee: Secondary | ICD-10-CM | POA: Diagnosis not present

## 2018-06-24 DIAGNOSIS — E1142 Type 2 diabetes mellitus with diabetic polyneuropathy: Secondary | ICD-10-CM | POA: Diagnosis not present

## 2018-06-24 DIAGNOSIS — E782 Mixed hyperlipidemia: Secondary | ICD-10-CM | POA: Diagnosis not present

## 2018-06-24 DIAGNOSIS — M109 Gout, unspecified: Secondary | ICD-10-CM | POA: Diagnosis not present

## 2018-06-24 DIAGNOSIS — I10 Essential (primary) hypertension: Secondary | ICD-10-CM | POA: Diagnosis not present

## 2018-06-24 DIAGNOSIS — K210 Gastro-esophageal reflux disease with esophagitis: Secondary | ICD-10-CM | POA: Diagnosis not present

## 2018-06-25 DIAGNOSIS — M48062 Spinal stenosis, lumbar region with neurogenic claudication: Secondary | ICD-10-CM | POA: Diagnosis not present

## 2018-06-25 DIAGNOSIS — M4316 Spondylolisthesis, lumbar region: Secondary | ICD-10-CM | POA: Diagnosis not present

## 2018-07-02 DIAGNOSIS — Z6832 Body mass index (BMI) 32.0-32.9, adult: Secondary | ICD-10-CM | POA: Diagnosis not present

## 2018-07-02 DIAGNOSIS — I10 Essential (primary) hypertension: Secondary | ICD-10-CM | POA: Diagnosis not present

## 2018-07-02 DIAGNOSIS — I2510 Atherosclerotic heart disease of native coronary artery without angina pectoris: Secondary | ICD-10-CM | POA: Diagnosis not present

## 2018-07-02 DIAGNOSIS — G6289 Other specified polyneuropathies: Secondary | ICD-10-CM | POA: Diagnosis not present

## 2018-07-02 DIAGNOSIS — M545 Low back pain: Secondary | ICD-10-CM | POA: Diagnosis not present

## 2018-07-02 DIAGNOSIS — I482 Chronic atrial fibrillation, unspecified: Secondary | ICD-10-CM | POA: Diagnosis not present

## 2018-07-02 DIAGNOSIS — E1142 Type 2 diabetes mellitus with diabetic polyneuropathy: Secondary | ICD-10-CM | POA: Diagnosis not present

## 2018-07-02 DIAGNOSIS — E782 Mixed hyperlipidemia: Secondary | ICD-10-CM | POA: Diagnosis not present

## 2018-07-31 ENCOUNTER — Other Ambulatory Visit: Payer: Self-pay | Admitting: Internal Medicine

## 2018-07-31 DIAGNOSIS — I480 Paroxysmal atrial fibrillation: Secondary | ICD-10-CM

## 2018-08-03 DIAGNOSIS — M170 Bilateral primary osteoarthritis of knee: Secondary | ICD-10-CM | POA: Diagnosis not present

## 2018-08-24 ENCOUNTER — Ambulatory Visit (INDEPENDENT_AMBULATORY_CARE_PROVIDER_SITE_OTHER): Payer: Medicare Other | Admitting: *Deleted

## 2018-08-24 DIAGNOSIS — I4901 Ventricular fibrillation: Secondary | ICD-10-CM

## 2018-08-24 DIAGNOSIS — I5022 Chronic systolic (congestive) heart failure: Secondary | ICD-10-CM

## 2018-08-25 ENCOUNTER — Telehealth: Payer: Self-pay

## 2018-08-25 NOTE — Telephone Encounter (Signed)
Left message for patient to remind of missed remote transmission.  

## 2018-08-26 LAB — CUP PACEART REMOTE DEVICE CHECK
Battery Remaining Longevity: 56 mo
Battery Voltage: 2.95 V
Brady Statistic AP VP Percent: 0.01 %
Brady Statistic AP VS Percent: 19.36 %
Brady Statistic AS VP Percent: 0.05 %
Brady Statistic AS VS Percent: 80.57 %
Brady Statistic RA Percent Paced: 19.24 %
Brady Statistic RV Percent Paced: 0.06 %
Date Time Interrogation Session: 20200609214512
HighPow Impedance: 72 Ohm
Implantable Lead Implant Date: 20151202
Implantable Lead Implant Date: 20151202
Implantable Lead Location: 753859
Implantable Lead Location: 753860
Implantable Lead Model: 5076
Implantable Lead Model: 6935
Implantable Pulse Generator Implant Date: 20151202
Lead Channel Impedance Value: 380 Ohm
Lead Channel Impedance Value: 399 Ohm
Lead Channel Impedance Value: 437 Ohm
Lead Channel Pacing Threshold Amplitude: 0.625 V
Lead Channel Pacing Threshold Amplitude: 0.75 V
Lead Channel Pacing Threshold Pulse Width: 0.4 ms
Lead Channel Pacing Threshold Pulse Width: 0.4 ms
Lead Channel Sensing Intrinsic Amplitude: 2.625 mV
Lead Channel Sensing Intrinsic Amplitude: 2.625 mV
Lead Channel Sensing Intrinsic Amplitude: 7.875 mV
Lead Channel Sensing Intrinsic Amplitude: 7.875 mV
Lead Channel Setting Pacing Amplitude: 1.5 V
Lead Channel Setting Pacing Amplitude: 2 V
Lead Channel Setting Pacing Pulse Width: 0.4 ms
Lead Channel Setting Sensing Sensitivity: 0.3 mV

## 2018-09-02 NOTE — Progress Notes (Signed)
Remote ICD transmission.   

## 2018-10-21 DIAGNOSIS — E782 Mixed hyperlipidemia: Secondary | ICD-10-CM | POA: Diagnosis not present

## 2018-10-21 DIAGNOSIS — K210 Gastro-esophageal reflux disease with esophagitis: Secondary | ICD-10-CM | POA: Diagnosis not present

## 2018-10-21 DIAGNOSIS — I10 Essential (primary) hypertension: Secondary | ICD-10-CM | POA: Diagnosis not present

## 2018-10-21 DIAGNOSIS — M109 Gout, unspecified: Secondary | ICD-10-CM | POA: Diagnosis not present

## 2018-10-21 DIAGNOSIS — E1142 Type 2 diabetes mellitus with diabetic polyneuropathy: Secondary | ICD-10-CM | POA: Diagnosis not present

## 2018-10-26 DIAGNOSIS — M545 Low back pain: Secondary | ICD-10-CM | POA: Diagnosis not present

## 2018-10-26 DIAGNOSIS — E782 Mixed hyperlipidemia: Secondary | ICD-10-CM | POA: Diagnosis not present

## 2018-10-26 DIAGNOSIS — Z6833 Body mass index (BMI) 33.0-33.9, adult: Secondary | ICD-10-CM | POA: Diagnosis not present

## 2018-10-26 DIAGNOSIS — I10 Essential (primary) hypertension: Secondary | ICD-10-CM | POA: Diagnosis not present

## 2018-10-26 DIAGNOSIS — I2510 Atherosclerotic heart disease of native coronary artery without angina pectoris: Secondary | ICD-10-CM | POA: Diagnosis not present

## 2018-10-26 DIAGNOSIS — E1142 Type 2 diabetes mellitus with diabetic polyneuropathy: Secondary | ICD-10-CM | POA: Diagnosis not present

## 2018-10-26 DIAGNOSIS — I4891 Unspecified atrial fibrillation: Secondary | ICD-10-CM | POA: Diagnosis not present

## 2018-10-26 DIAGNOSIS — M1711 Unilateral primary osteoarthritis, right knee: Secondary | ICD-10-CM | POA: Diagnosis not present

## 2018-11-24 ENCOUNTER — Ambulatory Visit (INDEPENDENT_AMBULATORY_CARE_PROVIDER_SITE_OTHER): Payer: Medicare Other | Admitting: *Deleted

## 2018-11-24 DIAGNOSIS — I4901 Ventricular fibrillation: Secondary | ICD-10-CM | POA: Diagnosis not present

## 2018-11-24 LAB — CUP PACEART REMOTE DEVICE CHECK
Battery Remaining Longevity: 47 mo
Battery Voltage: 2.97 V
Brady Statistic AP VP Percent: 0.02 %
Brady Statistic AP VS Percent: 16.92 %
Brady Statistic AS VP Percent: 0.06 %
Brady Statistic AS VS Percent: 83 %
Brady Statistic RA Percent Paced: 16.73 %
Brady Statistic RV Percent Paced: 0.08 %
Date Time Interrogation Session: 20200908122548
HighPow Impedance: 72 Ohm
Implantable Lead Implant Date: 20151202
Implantable Lead Implant Date: 20151202
Implantable Lead Location: 753859
Implantable Lead Location: 753860
Implantable Lead Model: 5076
Implantable Lead Model: 6935
Implantable Pulse Generator Implant Date: 20151202
Lead Channel Impedance Value: 380 Ohm
Lead Channel Impedance Value: 380 Ohm
Lead Channel Impedance Value: 399 Ohm
Lead Channel Pacing Threshold Amplitude: 0.625 V
Lead Channel Pacing Threshold Amplitude: 0.75 V
Lead Channel Pacing Threshold Pulse Width: 0.4 ms
Lead Channel Pacing Threshold Pulse Width: 0.4 ms
Lead Channel Sensing Intrinsic Amplitude: 2.375 mV
Lead Channel Sensing Intrinsic Amplitude: 2.375 mV
Lead Channel Sensing Intrinsic Amplitude: 7.625 mV
Lead Channel Sensing Intrinsic Amplitude: 7.625 mV
Lead Channel Setting Pacing Amplitude: 1.5 V
Lead Channel Setting Pacing Amplitude: 2 V
Lead Channel Setting Pacing Pulse Width: 0.4 ms
Lead Channel Setting Sensing Sensitivity: 0.3 mV

## 2018-12-03 DIAGNOSIS — Z23 Encounter for immunization: Secondary | ICD-10-CM | POA: Diagnosis not present

## 2018-12-04 DIAGNOSIS — M170 Bilateral primary osteoarthritis of knee: Secondary | ICD-10-CM | POA: Diagnosis not present

## 2018-12-10 ENCOUNTER — Encounter: Payer: Self-pay | Admitting: Cardiology

## 2018-12-10 NOTE — Progress Notes (Signed)
Remote ICD transmission.   

## 2018-12-16 DIAGNOSIS — E785 Hyperlipidemia, unspecified: Secondary | ICD-10-CM | POA: Diagnosis not present

## 2018-12-16 DIAGNOSIS — I10 Essential (primary) hypertension: Secondary | ICD-10-CM | POA: Diagnosis not present

## 2018-12-17 DIAGNOSIS — L259 Unspecified contact dermatitis, unspecified cause: Secondary | ICD-10-CM | POA: Diagnosis not present

## 2018-12-21 ENCOUNTER — Other Ambulatory Visit: Payer: Self-pay | Admitting: Internal Medicine

## 2018-12-21 DIAGNOSIS — M170 Bilateral primary osteoarthritis of knee: Secondary | ICD-10-CM | POA: Diagnosis not present

## 2019-02-05 DIAGNOSIS — H2512 Age-related nuclear cataract, left eye: Secondary | ICD-10-CM | POA: Diagnosis not present

## 2019-02-05 DIAGNOSIS — H524 Presbyopia: Secondary | ICD-10-CM | POA: Diagnosis not present

## 2019-02-05 DIAGNOSIS — E1165 Type 2 diabetes mellitus with hyperglycemia: Secondary | ICD-10-CM | POA: Diagnosis not present

## 2019-02-05 DIAGNOSIS — E113293 Type 2 diabetes mellitus with mild nonproliferative diabetic retinopathy without macular edema, bilateral: Secondary | ICD-10-CM | POA: Diagnosis not present

## 2019-02-05 DIAGNOSIS — Z961 Presence of intraocular lens: Secondary | ICD-10-CM | POA: Diagnosis not present

## 2019-02-15 DIAGNOSIS — E782 Mixed hyperlipidemia: Secondary | ICD-10-CM | POA: Diagnosis not present

## 2019-02-15 DIAGNOSIS — I10 Essential (primary) hypertension: Secondary | ICD-10-CM | POA: Diagnosis not present

## 2019-02-23 ENCOUNTER — Ambulatory Visit (INDEPENDENT_AMBULATORY_CARE_PROVIDER_SITE_OTHER): Payer: Medicare Other | Admitting: *Deleted

## 2019-02-23 DIAGNOSIS — I469 Cardiac arrest, cause unspecified: Secondary | ICD-10-CM

## 2019-02-23 LAB — CUP PACEART REMOTE DEVICE CHECK
Date Time Interrogation Session: 20201208093808
Implantable Lead Implant Date: 20151202
Implantable Lead Implant Date: 20151202
Implantable Lead Location: 753859
Implantable Lead Location: 753860
Implantable Lead Model: 5076
Implantable Lead Model: 6935
Implantable Pulse Generator Implant Date: 20151202

## 2019-02-26 DIAGNOSIS — E349 Endocrine disorder, unspecified: Secondary | ICD-10-CM | POA: Diagnosis not present

## 2019-02-26 DIAGNOSIS — K219 Gastro-esophageal reflux disease without esophagitis: Secondary | ICD-10-CM | POA: Diagnosis not present

## 2019-02-26 DIAGNOSIS — R5383 Other fatigue: Secondary | ICD-10-CM | POA: Diagnosis not present

## 2019-02-26 DIAGNOSIS — E782 Mixed hyperlipidemia: Secondary | ICD-10-CM | POA: Diagnosis not present

## 2019-02-26 DIAGNOSIS — E1142 Type 2 diabetes mellitus with diabetic polyneuropathy: Secondary | ICD-10-CM | POA: Diagnosis not present

## 2019-02-26 DIAGNOSIS — R946 Abnormal results of thyroid function studies: Secondary | ICD-10-CM | POA: Diagnosis not present

## 2019-02-26 DIAGNOSIS — I10 Essential (primary) hypertension: Secondary | ICD-10-CM | POA: Diagnosis not present

## 2019-03-16 NOTE — H&P (Signed)
Surgical History & Physical  Patient Name: Trevor [name] DOB: 05/15/1940  Surgery: Cataract extraction with intraocular lens implant phacoemulsification; Left Eye  Surgeon: Baruch Goldmann MD Surgery Date:  03/25/2019 Pre-Op Date:  03/15/2019  HPI: A 56 Yr. old male patient 1. 1. The patient complains of difficulty when driving, which began 1 years ago. The left eye is affected. The episode is constant and gradual. The patient describes foggy and hazy symptoms affecting their eyes/vision. The condition's severity is moderate. Symptoms occur when the patient is driving and reading. This is negatively affecting the patient's quality of life. HPI Completed by Dr. 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  Social   Former smoker   Medication Eliquis, Metformin, Metoprolol, Novolin 70/30, Hydrochlorothiazide, Lisinopril, Allopurinol, Colcrys, Tramadol, Atorvastatin, Gabapentin,   Sx/Procedures Phaco c IOL OD,  ICD implant 2015, Back surgery-rods and spacers,   Drug Allergies   NKDA  History & Physical: Heent:  Cataract, Left eye NECK: supple without bruits LUNGS: lungs clear to auscultation CV: regular rate and rhythm Abdomen: soft and non-tender  Impression & Plan: Assessment: 1.  NUCLEAR SCLEROSIS AGE RELATED; Left Eye (H25.12) 2.  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  INTRAOCULAR LENS IOL (Z96.1)  Plan: 1.  Cataract accounts for the patient's decreased vision. This visual impairment is not correctable with a tolerable change in glasses or contact lenses. Cataract surgery with an implantation of a new lens should significantly improve the visual and functional status of  the patient. Discussed all risks, benefits, alternatives, and potential complications. Discussed the procedures and recovery. Patient desires to have surgery. A-scan ordered and performed today for intra-ocular lens calculations. The surgery will be performed in order to improve vision for driving, reading, and for eye examinations. Recommend phacoemulsification with intra-ocular lens. Left Eye. Surgery required to correct imbalance of vision. Dilates poorly - shugacaine by protocol. Omidira. 2.  Has had treatment with Dr. Zadie Rhine - appreciate his care for this very pleasant patient. 3.  Stable - excellent outcome.

## 2019-03-18 NOTE — Patient Instructions (Signed)
Your procedure is scheduled on:   03/25/2019              Report to [facility] at   7:45  AM.  Call this number if you have problems the morning of surgery: [phone]   Do not eat or drink :After Midnight.    Take these medicines the morning of surgery with A SIP OF WATER:    Gabapentin and tramadol if needed        Do not wear jewelry, make-up or nail polish.  Do not wear lotions, powders, or perfumes. You may wear deodorant.  Do not bring valuables to the hospital.  Contacts, dentures or bridgework may not be worn into surgery.  Patients discharged the day of surgery will not be allowed to drive home.  Name and phone number of your driver.                                                                                                                                       Cataract Surgery  A cataract is a clouding of the lens of the eye. When a lens becomes cloudy, vision is reduced based on the degree and nature of the clouding. Surgery may be needed to improve vision. Surgery removes the cloudy lens and usually replaces it with a substitute lens (intraocular lens, IOL). LET YOUR EYE DOCTOR KNOW ABOUT:  Allergies to food or medicine.   Medicines taken including herbs, eyedrops, over-the-counter medicines, and creams.   Use of steroids (by mouth or creams).   Previous problems with anesthetics or numbing medicine.   History of bleeding problems or blood clots.   Previous surgery.   Other health problems, including diabetes and kidney problems.   Possibility of pregnancy, if this applies.  RISKS AND COMPLICATIONS  Infection.   Inflammation of the eyeball (endophthalmitis) that can spread to both eyes (sympathetic ophthalmia).   Poor wound healing.   If an IOL is inserted, it can later fall out of proper position. This is very uncommon.   Clouding of the part of your eye that holds an IOL in place. This is called an "after-cataract." These are uncommon, but easily  treated.  BEFORE THE PROCEDURE  Do not eat or drink anything except small amounts of water for 8 to 12 before your surgery, or as directed by your caregiver.    Unless you are told otherwise, continue any eyedrops you have been prescribed.   Talk to your primary caregiver about all other medicines that you take (both prescription and non-prescription). In some cases, you may need to stop or change medicines near the time of your surgery. This is most important if you are taking blood-thinning medicine. Do not stop medicines unless you are told to do so.   Arrange for someone to drive you to and from the procedure.   Do not put contact lenses in either eye  on the day of your surgery.  PROCEDURE There is more than one method for safely removing a cataract. Your doctor can explain the differences and help determine which is best for you. Phacoemulsification surgery is the most common form of cataract surgery.  An injection is given behind the eye or eyedrops are given to make this a painless procedure.   A small cut (incision) is made on the edge of the clear, dome-shaped surface that covers the front of the eye (cornea).   A tiny probe is painlessly inserted into the eye. This device gives off ultrasound waves that soften and break up the cloudy center of the lens. This makes it easier for the cloudy lens to be removed by suction.   An IOL may be implanted.   The normal lens of the eye is covered by a clear capsule. Part of that capsule is intentionally left in the eye to support the IOL.   Your surgeon may or may not use stitches to close the incision.  There are other forms of cataract surgery that require a larger incision and stiches to close the eye. This approach is taken in cases where the doctor feels that the cataract cannot be easily removed using phacoemulsification. AFTER THE PROCEDURE  When an IOL is implanted, it does not need care. It becomes a permanent part of your eye  and cannot be seen or felt.   Your doctor will schedule follow-up exams to check on your progress.   Review your other medicines with your doctor to see which can be resumed after surgery.   Use eyedrops or take medicine as prescribed by your doctor.  Document Released: 02/21/2011 Document Reviewed: 02/18/2011 [facility] Patient Information 2012 [place].  .Cataract Surgery Care After Refer to this sheet in the next few weeks. These instructions provide you with information on caring for yourself after your procedure. Your caregiver may also give you more specific instructions. Your treatment has been planned according to current medical practices, but problems sometimes occur. Call your caregiver if you have any problems or questions after your procedure.  HOME CARE INSTRUCTIONS   Avoid strenuous activities as directed by your caregiver.   Ask your caregiver when you can resume driving.   Use eyedrops or other medicines to help healing and control pressure inside your eye as directed by your caregiver.   Only take over-the-counter or prescription medicines for pain, discomfort, or fever as directed by your caregiver.   Do not to touch or rub your eyes.   You may be instructed to use a protective shield during the first few days and nights after surgery. If not, wear sunglasses to protect your eyes. This is to protect the eye from pressure or from being accidentally bumped.   Keep the area around your eye clean and dry. Avoid swimming or allowing water to hit you directly in the face while showering. Keep soap and shampoo out of your eyes.   Do not bend or lift heavy objects. Bending increases pressure in the eye. You can walk, climb stairs, and do light household chores.   Do not put a contact lens into the eye that had surgery until your caregiver says it is okay to do so.   Ask your doctor when you can return to work. This will depend on the kind of work that you do. If you  work in a dusty environment, you may be advised to wear protective eyewear for a period of time.  Ask your caregiver when it will be safe to engage in sexual activity.   Continue with your regular eye exams as directed by your caregiver.  What to expect:  It is normal to feel itching and mild discomfort for a few days after cataract surgery. Some fluid discharge is also common, and your eye may be sensitive to light and touch.   After 1 to 2 days, even moderate discomfort should disappear. In most cases, healing will take about 6 weeks.   If you received an intraocular lens (IOL), you may notice that colors are very bright or have a blue tinge. Also, if you have been in bright sunlight, everything may appear reddish for a few hours. If you see these color tinges, it is because your lens is clear and no longer cloudy. Within a few months after receiving an IOL, these extra colors should go away. When you have healed, you will probably need new glasses.  SEEK MEDICAL CARE IF:   You have increased bruising around your eye.   You have discomfort not helped by medicine.  SEEK IMMEDIATE MEDICAL CARE IF:   You have a  fever.   You have a worsening or sudden vision loss.   You have redness, swelling, or increasing pain in the eye.   You have a thick discharge from the eye that had surgery.  MAKE SURE YOU:  Understand these instructions.   Will watch your condition.   Will get help right away if you are not doing well or get worse.  Document Released: 09/21/2004 Document Revised: 02/21/2011 Document Reviewed: 10/26/2010 [facility] Patient Information 2012 [place].    Monitored Anesthesia Care  Monitored anesthesia care is an anesthesia service for a medical procedure. Anesthesia is the loss of the ability to feel pain. It is produced by medications called anesthetics. It may affect a small area of your body (local anesthesia), a large area of your body (regional anesthesia), or  your entire body (general anesthesia). The need for monitored anesthesia care depends your procedure, your condition, and the potential need for regional or general anesthesia. It is often provided during procedures where:   General anesthesia may be needed if there are complications. This is because you need special care when you are under general anesthesia.    You will be under local or regional anesthesia. This is so that you are able to have higher levels of anesthesia if needed.    You will receive calming medications (sedatives). This is especially the case if sedatives are given to put you in a semi-conscious state of relaxation (deep sedation). This is because the amount of sedative needed to produce this state can be hard to predict. Too much of a sedative can produce general anesthesia. Monitored anesthesia care is performed by one or more caregivers who have special training in all types of anesthesia. You will need to meet with these caregivers before your procedure. During this meeting, they will ask you about your medical history. They will also give you instructions to follow. (For example, you will need to stop eating and drinking before your procedure. You may also need to stop or change medications you are taking.) During your procedure, your caregivers will stay with you. They will:   Watch your condition. This includes watching you blood pressure, breathing, and level of pain.    Diagnose and treat problems that occur.    Give medications if they are needed. These may include calming medications (sedatives) and anesthetics.  Make sure you are comfortable.   Having monitored anesthesia care does not necessarily mean that you will be under anesthesia. It does mean that your caregivers will be able to manage anesthesia if you need it or if it occurs. It also means that you will be able to have a different type of anesthesia than you are having if you need it. When your procedure  is complete, your caregivers will continue to watch your condition. They will make sure any medications wear off before you are allowed to go home.  Document Released: 11/28/2004 Document Revised: 06/29/2012 Document Reviewed: 04/15/2012 [facility] Patient Information 2014 [place], [place].

## 2019-03-22 ENCOUNTER — Other Ambulatory Visit (HOSPITAL_COMMUNITY)
Admission: RE | Admit: 2019-03-22 | Discharge: 2019-03-22 | Disposition: A | Discharge status: Home / Self Care | Payer: Medicare Other | Source: Ambulatory Visit | Attending: Ophthalmology | Admitting: Ophthalmology

## 2019-03-22 ENCOUNTER — Other Ambulatory Visit: Payer: Self-pay

## 2019-03-22 ENCOUNTER — Encounter (HOSPITAL_COMMUNITY)
Admission: RE | Admit: 2019-03-22 | Discharge: 2019-03-22 | Disposition: A | Discharge status: Home / Self Care | Payer: Medicare Other | Source: Ambulatory Visit | Attending: Ophthalmology | Admitting: Ophthalmology

## 2019-03-22 DIAGNOSIS — H2512 Age-related nuclear cataract, left eye: Secondary | ICD-10-CM | POA: Diagnosis not present

## 2019-03-22 DIAGNOSIS — Z20822 Contact with and (suspected) exposure to covid-19: Secondary | ICD-10-CM | POA: Insufficient documentation

## 2019-03-22 DIAGNOSIS — Z01812 Encounter for preprocedural laboratory examination: Secondary | ICD-10-CM | POA: Insufficient documentation

## 2019-03-22 LAB — SARS CORONAVIRUS 2 (TAT 6-24 HRS): SARS Coronavirus 2: NEGATIVE

## 2019-03-25 ENCOUNTER — Other Ambulatory Visit: Payer: Self-pay

## 2019-03-25 ENCOUNTER — Ambulatory Visit (HOSPITAL_COMMUNITY)
Admission: RE | Admit: 2019-03-25 | Discharge: 2019-03-25 | Disposition: A | Discharge status: Home / Self Care | Payer: Medicare Other | Attending: Ophthalmology | Admitting: Ophthalmology

## 2019-03-25 ENCOUNTER — Encounter (HOSPITAL_COMMUNITY): Payer: Self-pay | Admitting: Ophthalmology

## 2019-03-25 ENCOUNTER — Ambulatory Visit (HOSPITAL_COMMUNITY): Payer: Medicare Other | Admitting: Anesthesiology

## 2019-03-25 ENCOUNTER — Encounter (HOSPITAL_COMMUNITY)
Admission: RE | Disposition: A | Discharge status: Home / Self Care | Payer: Self-pay | Source: Home / Self Care | Attending: Ophthalmology

## 2019-03-25 DIAGNOSIS — Z794 Long term (current) use of insulin: Secondary | ICD-10-CM | POA: Insufficient documentation

## 2019-03-25 DIAGNOSIS — E113293 Type 2 diabetes mellitus with mild nonproliferative diabetic retinopathy without macular edema, bilateral: Secondary | ICD-10-CM | POA: Diagnosis not present

## 2019-03-25 DIAGNOSIS — E1151 Type 2 diabetes mellitus with diabetic peripheral angiopathy without gangrene: Secondary | ICD-10-CM | POA: Insufficient documentation

## 2019-03-25 DIAGNOSIS — Z79891 Long term (current) use of opiate analgesic: Secondary | ICD-10-CM | POA: Diagnosis not present

## 2019-03-25 DIAGNOSIS — I2510 Atherosclerotic heart disease of native coronary artery without angina pectoris: Secondary | ICD-10-CM | POA: Diagnosis not present

## 2019-03-25 DIAGNOSIS — M109 Gout, unspecified: Secondary | ICD-10-CM | POA: Insufficient documentation

## 2019-03-25 DIAGNOSIS — M1990 Unspecified osteoarthritis, unspecified site: Secondary | ICD-10-CM | POA: Diagnosis not present

## 2019-03-25 DIAGNOSIS — Z955 Presence of coronary angioplasty implant and graft: Secondary | ICD-10-CM | POA: Insufficient documentation

## 2019-03-25 DIAGNOSIS — E1142 Type 2 diabetes mellitus with diabetic polyneuropathy: Secondary | ICD-10-CM | POA: Insufficient documentation

## 2019-03-25 DIAGNOSIS — Z79899 Other long term (current) drug therapy: Secondary | ICD-10-CM | POA: Diagnosis not present

## 2019-03-25 DIAGNOSIS — E1136 Type 2 diabetes mellitus with diabetic cataract: Secondary | ICD-10-CM | POA: Diagnosis not present

## 2019-03-25 DIAGNOSIS — I10 Essential (primary) hypertension: Secondary | ICD-10-CM | POA: Diagnosis not present

## 2019-03-25 DIAGNOSIS — Z87891 Personal history of nicotine dependence: Secondary | ICD-10-CM | POA: Insufficient documentation

## 2019-03-25 DIAGNOSIS — Z95810 Presence of automatic (implantable) cardiac defibrillator: Secondary | ICD-10-CM | POA: Insufficient documentation

## 2019-03-25 DIAGNOSIS — H2181 Floppy iris syndrome: Secondary | ICD-10-CM | POA: Insufficient documentation

## 2019-03-25 DIAGNOSIS — H2512 Age-related nuclear cataract, left eye: Secondary | ICD-10-CM | POA: Insufficient documentation

## 2019-03-25 DIAGNOSIS — Z7901 Long term (current) use of anticoagulants: Secondary | ICD-10-CM | POA: Diagnosis not present

## 2019-03-25 HISTORY — PX: CATARACT EXTRACTION W/PHACO: SHX586

## 2019-03-25 LAB — GLUCOSE, CAPILLARY: Glucose-Capillary: 159 mg/dL — ABNORMAL HIGH (ref 70–99)

## 2019-03-25 SURGERY — PHACOEMULSIFICATION, CATARACT, WITH IOL INSERTION
Anesthesia: Monitor Anesthesia Care | Site: Eye | Laterality: Left

## 2019-03-25 MED ORDER — PROVISC 10 MG/ML IO SOLN
INTRAOCULAR | Status: DC | PRN
  Administered 2019-03-25: 0.85 mL via INTRAOCULAR

## 2019-03-25 MED ORDER — PHENYLEPHRINE-KETOROLAC 1-0.3 % IO SOLN
INTRAOCULAR | Status: AC
  Filled 2019-03-25: qty 4

## 2019-03-25 MED ORDER — NEOMYCIN-POLYMYXIN-DEXAMETH 3.5-10000-0.1 OP SUSP
OPHTHALMIC | Status: DC | PRN
  Administered 2019-03-25: 1 [drp] via OPHTHALMIC

## 2019-03-25 MED ORDER — LIDOCAINE HCL (PF) 1 % IJ SOLN
INTRAOCULAR | Status: DC | PRN
  Administered 2019-03-25: 1 mL via OPHTHALMIC

## 2019-03-25 MED ORDER — SODIUM HYALURONATE 23 MG/ML IO SOLN
INTRAOCULAR | Status: DC | PRN
  Administered 2019-03-25: 0.6 mL via INTRAOCULAR

## 2019-03-25 MED ORDER — HYDROCODONE-ACETAMINOPHEN 7.5-325 MG PO TABS: 1.0000 | ORAL_TABLET | Freq: Once | ORAL | Status: DC | PRN

## 2019-03-25 MED ORDER — EPINEPHRINE PF 1 MG/ML IJ SOLN
INTRAMUSCULAR | Status: AC
  Filled 2019-03-25: qty 1

## 2019-03-25 MED ORDER — HYDROMORPHONE HCL 1 MG/ML IJ SOLN: 0.2500 mg | INTRAMUSCULAR | Status: DC | PRN

## 2019-03-25 MED ORDER — LIDOCAINE HCL 3.5 % OP GEL
1.0000 "application " | Freq: Once | OPHTHALMIC | Status: AC
  Administered 2019-03-25: 1 via OPHTHALMIC

## 2019-03-25 MED ORDER — PHENYLEPHRINE-KETOROLAC 1-0.3 % IO SOLN
INTRAOCULAR | Status: DC | PRN
  Administered 2019-03-25: 500 mL via OPHTHALMIC

## 2019-03-25 MED ORDER — PHENYLEPHRINE HCL 2.5 % OP SOLN
1.0000 [drp] | OPHTHALMIC | Status: AC | PRN
  Administered 2019-03-25 (×3): 1 [drp] via OPHTHALMIC

## 2019-03-25 MED ORDER — TETRACAINE HCL 0.5 % OP SOLN
1.0000 [drp] | OPHTHALMIC | Status: AC | PRN
  Administered 2019-03-25 (×3): 1 [drp] via OPHTHALMIC

## 2019-03-25 MED ORDER — CYCLOPENTOLATE-PHENYLEPHRINE 0.2-1 % OP SOLN
1.0000 [drp] | OPHTHALMIC | Status: AC | PRN
  Administered 2019-03-25 (×3): 1 [drp] via OPHTHALMIC

## 2019-03-25 MED ORDER — LACTATED RINGERS IV SOLN: INTRAVENOUS | Status: DC

## 2019-03-25 MED ORDER — MIDAZOLAM HCL 2 MG/2ML IJ SOLN: 0.5000 mg | Freq: Once | INTRAMUSCULAR | Status: DC | PRN

## 2019-03-25 MED ORDER — PROMETHAZINE HCL 25 MG/ML IJ SOLN: 6.2500 mg | INTRAMUSCULAR | Status: DC | PRN

## 2019-03-25 MED ORDER — BSS IO SOLN
INTRAOCULAR | Status: DC | PRN
  Administered 2019-03-25: 15 mL via INTRAOCULAR

## 2019-03-25 MED ORDER — POVIDONE-IODINE 5 % OP SOLN
OPHTHALMIC | Status: DC | PRN
  Administered 2019-03-25: 1 via OPHTHALMIC

## 2019-03-25 SURGICAL SUPPLY — 16 items
CLOTH BEACON ORANGE TIMEOUT ST (SAFETY) ×1 IMPLANT
EYE SHIELD UNIVERSAL CLEAR (GAUZE/BANDAGES/DRESSINGS) ×1 IMPLANT
GLOVE BIOGEL PI IND STRL 6.5 (GLOVE) IMPLANT
GLOVE BIOGEL PI IND STRL 7.0 (GLOVE) IMPLANT
GLOVE BIOGEL PI INDICATOR 6.5 (GLOVE) ×1
GLOVE BIOGEL PI INDICATOR 7.0 (GLOVE) ×1
LENS ALC ACRYL/TECN (Ophthalmic Related) ×1 IMPLANT
NDL HYPO 18GX1.5 BLUNT FILL (NEEDLE) IMPLANT
NEEDLE HYPO 18GX1.5 BLUNT FILL (NEEDLE) ×2 IMPLANT
PAD ARMBOARD 7.5X6 YLW CONV (MISCELLANEOUS) ×1 IMPLANT
RING MALYGIN 7.0 (MISCELLANEOUS) ×1 IMPLANT
SYR TB 1ML LL NO SAFETY (SYRINGE) ×1 IMPLANT
TAPE SURG TRANSPORE 1 IN (GAUZE/BANDAGES/DRESSINGS) IMPLANT
TAPE SURGICAL TRANSPORE 1 IN (GAUZE/BANDAGES/DRESSINGS) ×1
VISCOELASTIC ADDITIONAL (OPHTHALMIC RELATED) ×1 IMPLANT
WATER STERILE IRR 250ML POUR (IV SOLUTION) ×1 IMPLANT

## 2019-03-25 NOTE — Anesthesia Procedure Notes (Signed)
Procedure Name: MAC Date/Time: 03/25/2019 9:34 AM Performed by: Andree Elk Fawaz Borquez A, CRNA Pre-anesthesia Checklist: Patient identified, Emergency Drugs available, Suction available, Timeout performed and Patient being monitored Patient Re-evaluated:Patient Re-evaluated prior to induction Oxygen Delivery Method: Nasal Cannula

## 2019-03-25 NOTE — Op Note (Signed)
Date of procedure: 03/25/19  Pre-operative diagnosis: Visually significant age-related nuclear cataract, Left Eye; Poor dilation, Left eye (H25.12; H21.81)  Post-operative diagnosis: Visually significant cataract, Left Eye; Intra-operative Floppy Iris Syndrome, Left Eye  Procedure: Complex removal of cataract via phacoemulsification and insertion of intra-ocular lens Johnson and [place]  +18.0D into the capsular bag of the Left Eye (CPT [phone])  Attending surgeon: Gerda Diss. Garl Speigner, MD, MA  Anesthesia: MAC, Topical Akten  Complications: None  Estimated Blood Loss: <32m (minimal)  Specimens: None  Implants: As above  Indications:  Visually significant cataract, Left Eye  Procedure:  The patient was seen and identified in the pre-operative area. The operative eye was identified and dilated.  The operative eye was marked.  Topical anesthesia was administered to the operative eye.     The patient was then to the operative suite and placed in the supine position.  A timeout was performed confirming the patient, procedure to be performed, and all other relevant information.   The patient's face was prepped and draped in the usual fashion for intra-ocular surgery.  A lid speculum was placed into the operative eye and the surgical microscope moved into place and focused.  Poor dilation of the iris was confirmed.  An inferotemporal paracentesis was created using a 20 gauge paracentesis blade.  Shugarcaine was injected into the anterior chamber.  Viscoelastic was injected into the anterior chamber.  A temporal clear-corneal main wound incision was created using a 2.434mmicrokeratome.  A Malyugin ring was placed.  A continuous curvilinear capsulorrhexis was initiated using an irrigating cystitome and completed using capsulorrhexis forceps.  Hydrodissection and hydrodeliniation were performed.  Viscoelastic was injected into the anterior chamber.  A phacoemulsification handpiece and a chopper  as a second instrument were used to remove the nucleus and epinucleus. The irrigation/aspiration handpiece was used to remove any remaining cortical material.   The capsular bag was reinflated with viscoelastic, checked, and found to be intact.  The intraocular lens was inserted into the capsular bag and dialed into place using a Kuglen hook. The Malyugin ring was removed.  The irrigation/aspiration handpiece was used to remove any remaining viscoelastic.  The clear corneal wound and paracentesis wounds were then hydrated and checked with Weck-Cels to be watertight.  The lid-speculum and drape was removed, and the patient's face was cleaned with a wet and dry 4x4.  Maxitrol was instilled in the eye before a clear shield was taped over the eye. The patient was taken to the post-operative care unit in good condition, having tolerated the procedure well.  Post-Op Instructions: [facility] and will receive all other orders and instructions.

## 2019-03-25 NOTE — Discharge Instructions (Signed)
Please discharge patient when stable, will follow up today with Dr. Luella Gardenhire at the [facility] [place] office immediately following discharge.  Leave shield in place until visit.  All paperwork with discharge instructions will be given at the office.  [facility] Address:  [address]  [place], [place] [address]  

## 2019-03-25 NOTE — Anesthesia Preprocedure Evaluation (Signed)
Anesthesia Evaluation  Patient identified by MRN, date of birth, ID band Patient awake    Reviewed: Allergy & Precautions, NPO status , Patient's Chart, lab work & pertinent test results  Airway Mallampati: II  TM Distance: >3 FB Neck ROM: Full    Dental no notable dental hx. (+) Teeth Intact   Pulmonary neg pulmonary ROS, former smoker,    Pulmonary exam normal breath sounds clear to auscultation       Cardiovascular Exercise Tolerance: Poor hypertension, Pt. on medications + CAD and + Peripheral Vascular Disease  Normal cardiovascular exam+ Cardiac Defibrillator II Rhythm:Regular Rate:Normal  AICD and RCA stent in 2015  Uses cane for Bilat knee issues  Denies recent CP/DOE   Neuro/Psych negative neurological ROS  negative psych ROS   GI/Hepatic Neg liver ROS, GERD  Medicated and Controlled,  Endo/Other  negative endocrine ROSdiabetes, Type 1, Insulin Dependent  Renal/GU negative Renal ROS  negative genitourinary   Musculoskeletal  (+) Arthritis , Osteoarthritis,    Abdominal   Peds negative pediatric ROS (+)  Hematology negative hematology ROS (+)   Anesthesia Other Findings   Reproductive/Obstetrics negative OB ROS                             Anesthesia Physical Anesthesia Plan  ASA: III  Anesthesia Plan: MAC   Post-op Pain Management:    Induction: Intravenous  PONV Risk Score and Plan: 2 and TIVA and Treatment may vary due to age or medical condition  Airway Management Planned: Nasal Cannula and Simple Face Mask  Additional Equipment:   Intra-op Plan:   Post-operative Plan:   Informed Consent: I have reviewed the patients History and Physical, chart, labs and discussed the procedure including the risks, benefits and alternatives for the proposed anesthesia with the patient or authorized representative who has indicated his/her understanding and acceptance.      Dental advisory given  Plan Discussed with: CRNA  Anesthesia Plan Comments: (Plan Full PPE use  Plan MAC d/w pt -WTP with same after Q&A  Pt has had other IOL ~6 years ago )        Anesthesia Quick Evaluation

## 2019-03-25 NOTE — Transfer of Care (Signed)
Immediate Anesthesia Transfer of Care Note  Patient: Trevor [name]  Procedure(s) Performed: CATARACT EXTRACTION PHACO AND INTRAOCULAR LENS PLACEMENT (IOC) (Left Eye)  Patient Location: Short Stay  Anesthesia Type:MAC  Level of Consciousness: awake, alert , oriented and patient cooperative  Airway & Oxygen Therapy: Patient Spontanous Breathing  Post-op Assessment: Report given to RN and Post -op Vital signs reviewed and stable  Post vital signs: Reviewed and stable  Last Vitals:  Vitals Value Taken Time  BP    Temp    Pulse    Resp    SpO2      Last Pain:  Vitals:   03/25/19 0853  TempSrc: Oral  PainSc: 0-No pain      Patients Stated Pain Goal: 8 (77/41/42 3953)  Complications: No apparent anesthesia complications

## 2019-03-25 NOTE — Anesthesia Postprocedure Evaluation (Signed)
Anesthesia Post Note  Patient: Trevor [name]  Procedure(s) Performed: CATARACT EXTRACTION PHACO AND INTRAOCULAR LENS PLACEMENT (IOC) (Left Eye)  Patient location during evaluation: PACU Anesthesia Type: MAC Level of consciousness: awake and alert, oriented and patient cooperative Pain management: pain level controlled Vital Signs Assessment: post-procedure vital signs reviewed and stable Respiratory status: spontaneous breathing Cardiovascular status: stable Postop Assessment: no apparent nausea or vomiting Anesthetic complications: no     Last Vitals:  Vitals:   03/25/19 0853  BP: (!) 154/75  Pulse: 69  Resp: 18  Temp: 36.5 C  SpO2: 98%    Last Pain:  Vitals:   03/25/19 0853  TempSrc: Oral  PainSc: 0-No pain                 Masaichi Kracht A

## 2019-03-25 NOTE — Interval H&P Note (Signed)
History and Physical Interval Note: The H and P was reviewed and updated. The patient was examined.  No changes were found after exam.  The surgical eye was marked.  03/25/2019 9:34 AM  Ronny A Gramling  has presented today for surgery, with the diagnosis of nuclear cataract left eye.  The various methods of treatment have been discussed with the patient and family. After consideration of risks, benefits and other options for treatment, the patient has consented to  Procedure(s) with comments: CATARACT EXTRACTION PHACO AND INTRAOCULAR LENS PLACEMENT ([place]) (Left) - left as a surgical intervention.  The patient's history has been reviewed, patient examined, no change in status, stable for surgery.  I have reviewed the patient's chart and labs.  Questions were answered to the patient's satisfaction.     Baruch Goldmann

## 2019-03-25 NOTE — Addendum Note (Signed)
Addendum  created 03/25/19 1107 by Mickel Baas, CRNA   Intraprocedure Event edited, Intraprocedure Staff edited

## 2019-03-26 NOTE — Progress Notes (Signed)
ICD remote 

## 2019-04-16 DIAGNOSIS — I10 Essential (primary) hypertension: Secondary | ICD-10-CM | POA: Diagnosis not present

## 2019-04-16 DIAGNOSIS — E7849 Other hyperlipidemia: Secondary | ICD-10-CM | POA: Diagnosis not present

## 2019-04-22 DIAGNOSIS — Z23 Encounter for immunization: Secondary | ICD-10-CM | POA: Diagnosis not present

## 2019-05-21 DIAGNOSIS — Z23 Encounter for immunization: Secondary | ICD-10-CM | POA: Diagnosis not present

## 2019-06-04 DIAGNOSIS — M170 Bilateral primary osteoarthritis of knee: Secondary | ICD-10-CM | POA: Diagnosis not present

## 2019-06-16 DIAGNOSIS — I10 Essential (primary) hypertension: Secondary | ICD-10-CM | POA: Diagnosis not present

## 2019-06-16 DIAGNOSIS — K219 Gastro-esophageal reflux disease without esophagitis: Secondary | ICD-10-CM | POA: Diagnosis not present

## 2019-07-02 ENCOUNTER — Ambulatory Visit (INDEPENDENT_AMBULATORY_CARE_PROVIDER_SITE_OTHER): Payer: Medicare Other | Admitting: *Deleted

## 2019-07-02 DIAGNOSIS — I469 Cardiac arrest, cause unspecified: Secondary | ICD-10-CM | POA: Diagnosis not present

## 2019-07-04 LAB — CUP PACEART REMOTE DEVICE CHECK
Battery Remaining Longevity: 40 mo
Battery Voltage: 2.96 V
Brady Statistic AP VP Percent: 0.05 %
Brady Statistic AP VS Percent: 19.52 %
Brady Statistic AS VP Percent: 0.08 %
Brady Statistic AS VS Percent: 80.35 %
Brady Statistic RA Percent Paced: 19.36 %
Brady Statistic RV Percent Paced: 0.13 %
Date Time Interrogation Session: 20210416170734
HighPow Impedance: 79 Ohm
Implantable Lead Implant Date: 20151202
Implantable Lead Implant Date: 20151202
Implantable Lead Location: 753859
Implantable Lead Location: 753860
Implantable Lead Model: 5076
Implantable Lead Model: 6935
Implantable Pulse Generator Implant Date: 20151202
Lead Channel Impedance Value: 399 Ohm
Lead Channel Impedance Value: 399 Ohm
Lead Channel Impedance Value: 456 Ohm
Lead Channel Pacing Threshold Amplitude: 0.625 V
Lead Channel Pacing Threshold Amplitude: 0.625 V
Lead Channel Pacing Threshold Pulse Width: 0.4 ms
Lead Channel Pacing Threshold Pulse Width: 0.4 ms
Lead Channel Sensing Intrinsic Amplitude: 3.125 mV
Lead Channel Sensing Intrinsic Amplitude: 3.125 mV
Lead Channel Sensing Intrinsic Amplitude: 8.75 mV
Lead Channel Sensing Intrinsic Amplitude: 8.75 mV
Lead Channel Setting Pacing Amplitude: 1.5 V
Lead Channel Setting Pacing Amplitude: 2 V
Lead Channel Setting Pacing Pulse Width: 0.4 ms
Lead Channel Setting Sensing Sensitivity: 0.3 mV

## 2019-07-05 NOTE — Progress Notes (Signed)
ICD Remote  

## 2019-08-09 ENCOUNTER — Other Ambulatory Visit: Payer: Self-pay | Admitting: Internal Medicine

## 2019-08-09 DIAGNOSIS — I480 Paroxysmal atrial fibrillation: Secondary | ICD-10-CM

## 2019-10-01 ENCOUNTER — Ambulatory Visit (INDEPENDENT_AMBULATORY_CARE_PROVIDER_SITE_OTHER): Payer: Medicare Other | Admitting: *Deleted

## 2019-10-01 DIAGNOSIS — I4729 Other ventricular tachycardia: Secondary | ICD-10-CM

## 2019-10-01 DIAGNOSIS — I472 Ventricular tachycardia: Secondary | ICD-10-CM

## 2019-10-03 LAB — CUP PACEART REMOTE DEVICE CHECK
Battery Remaining Longevity: 41 mo
Battery Voltage: 2.94 V
Brady Statistic AP VP Percent: 0.02 %
Brady Statistic AP VS Percent: 12.9 %
Brady Statistic AS VP Percent: 0.05 %
Brady Statistic AS VS Percent: 87.03 %
Brady Statistic RA Percent Paced: 12.83 %
Brady Statistic RV Percent Paced: 0.07 %
Date Time Interrogation Session: 20210718063334
HighPow Impedance: 84 Ohm
Implantable Lead Implant Date: 20151202
Implantable Lead Implant Date: 20151202
Implantable Lead Location: 753859
Implantable Lead Location: 753860
Implantable Lead Model: 5076
Implantable Lead Model: 6935
Implantable Pulse Generator Implant Date: 20151202
Lead Channel Impedance Value: 380 Ohm
Lead Channel Impedance Value: 399 Ohm
Lead Channel Impedance Value: 437 Ohm
Lead Channel Pacing Threshold Amplitude: 0.5 V
Lead Channel Pacing Threshold Amplitude: 0.625 V
Lead Channel Pacing Threshold Pulse Width: 0.4 ms
Lead Channel Pacing Threshold Pulse Width: 0.4 ms
Lead Channel Sensing Intrinsic Amplitude: 2.125 mV
Lead Channel Sensing Intrinsic Amplitude: 2.125 mV
Lead Channel Sensing Intrinsic Amplitude: 9.625 mV
Lead Channel Sensing Intrinsic Amplitude: 9.625 mV
Lead Channel Setting Pacing Amplitude: 1.5 V
Lead Channel Setting Pacing Amplitude: 2 V
Lead Channel Setting Pacing Pulse Width: 0.4 ms
Lead Channel Setting Sensing Sensitivity: 0.3 mV

## 2019-10-04 NOTE — Progress Notes (Signed)
Remote ICD transmission.   

## 2019-10-26 ENCOUNTER — Other Ambulatory Visit: Payer: Self-pay | Admitting: Internal Medicine

## 2019-10-27 NOTE — Telephone Encounter (Signed)
This is a [place] pt.  °

## 2019-11-12 ENCOUNTER — Other Ambulatory Visit: Payer: Self-pay | Admitting: Internal Medicine

## 2019-12-07 DIAGNOSIS — M170 Bilateral primary osteoarthritis of knee: Secondary | ICD-10-CM | POA: Diagnosis not present

## 2019-12-31 ENCOUNTER — Ambulatory Visit (INDEPENDENT_AMBULATORY_CARE_PROVIDER_SITE_OTHER): Payer: Medicare Other

## 2019-12-31 DIAGNOSIS — I4901 Ventricular fibrillation: Secondary | ICD-10-CM

## 2019-12-31 DIAGNOSIS — I5022 Chronic systolic (congestive) heart failure: Secondary | ICD-10-CM

## 2020-01-01 LAB — CUP PACEART REMOTE DEVICE CHECK
Battery Remaining Longevity: 36 mo
Battery Voltage: 2.95 V
Brady Statistic AP VP Percent: 0.02 %
Brady Statistic AP VS Percent: 15.75 %
Brady Statistic AS VP Percent: 0.05 %
Brady Statistic AS VS Percent: 84.18 %
Brady Statistic RA Percent Paced: 15.52 %
Brady Statistic RV Percent Paced: 0.07 %
Date Time Interrogation Session: 20211015180709
HighPow Impedance: 79 Ohm
Implantable Lead Implant Date: 20151202
Implantable Lead Implant Date: 20151202
Implantable Lead Location: 753859
Implantable Lead Location: 753860
Implantable Lead Model: 5076
Implantable Lead Model: 6935
Implantable Pulse Generator Implant Date: 20151202
Lead Channel Impedance Value: 323 Ohm
Lead Channel Impedance Value: 399 Ohm
Lead Channel Impedance Value: 437 Ohm
Lead Channel Pacing Threshold Amplitude: 0.5 V
Lead Channel Pacing Threshold Amplitude: 0.875 V
Lead Channel Pacing Threshold Pulse Width: 0.4 ms
Lead Channel Pacing Threshold Pulse Width: 0.4 ms
Lead Channel Sensing Intrinsic Amplitude: 2.375 mV
Lead Channel Sensing Intrinsic Amplitude: 2.375 mV
Lead Channel Sensing Intrinsic Amplitude: 9.25 mV
Lead Channel Sensing Intrinsic Amplitude: 9.25 mV
Lead Channel Setting Pacing Amplitude: 1.5 V
Lead Channel Setting Pacing Amplitude: 2 V
Lead Channel Setting Pacing Pulse Width: 0.4 ms
Lead Channel Setting Sensing Sensitivity: 0.3 mV

## 2020-01-05 ENCOUNTER — Ambulatory Visit (INDEPENDENT_AMBULATORY_CARE_PROVIDER_SITE_OTHER): Payer: Medicare Other | Admitting: Internal Medicine

## 2020-01-05 ENCOUNTER — Other Ambulatory Visit: Payer: Self-pay

## 2020-01-05 ENCOUNTER — Encounter: Payer: Self-pay | Admitting: Internal Medicine

## 2020-01-05 VITALS — BP 140/82 | HR 73 | Ht 73.0 in | Wt 251.0 lb

## 2020-01-05 DIAGNOSIS — I4901 Ventricular fibrillation: Secondary | ICD-10-CM

## 2020-01-05 DIAGNOSIS — I5022 Chronic systolic (congestive) heart failure: Secondary | ICD-10-CM

## 2020-01-05 LAB — CUP PACEART INCLINIC DEVICE CHECK
Battery Remaining Longevity: 37 mo
Battery Voltage: 2.93 V
Brady Statistic AP VP Percent: 0.02 %
Brady Statistic AP VS Percent: 16.99 %
Brady Statistic AS VP Percent: 0.06 %
Brady Statistic AS VS Percent: 82.92 %
Brady Statistic RA Percent Paced: 16.82 %
Brady Statistic RV Percent Paced: 0.08 %
Date Time Interrogation Session: 20211020130951
HighPow Impedance: 79 Ohm
Implantable Lead Implant Date: 20151202
Implantable Lead Implant Date: 20151202
Implantable Lead Location: 753859
Implantable Lead Location: 753860
Implantable Lead Model: 5076
Implantable Lead Model: 6935
Implantable Pulse Generator Implant Date: 20151202
Lead Channel Impedance Value: 342 Ohm
Lead Channel Impedance Value: 399 Ohm
Lead Channel Impedance Value: 437 Ohm
Lead Channel Pacing Threshold Amplitude: 0.5 V
Lead Channel Pacing Threshold Amplitude: 0.75 V
Lead Channel Pacing Threshold Pulse Width: 0.4 ms
Lead Channel Pacing Threshold Pulse Width: 0.4 ms
Lead Channel Sensing Intrinsic Amplitude: 10 mV
Lead Channel Sensing Intrinsic Amplitude: 10 mV
Lead Channel Sensing Intrinsic Amplitude: 2.25 mV
Lead Channel Sensing Intrinsic Amplitude: 2.25 mV
Lead Channel Setting Pacing Amplitude: 1.5 V
Lead Channel Setting Pacing Amplitude: 2 V
Lead Channel Setting Pacing Pulse Width: 0.4 ms
Lead Channel Setting Sensing Sensitivity: 0.3 mV

## 2020-01-05 NOTE — Patient Instructions (Signed)
Medication Instructions:  Your physician recommends that you continue on your current medications as directed. Please refer to the Current Medication list given to you today.  *If you need a refill on your cardiac medications before your next appointment, please call your pharmacy*   Lab Work: None   If you have labs (blood work) drawn today and your tests are completely normal, you will receive your results only by: Marland Kitchen MyChart Message (if you have MyChart) OR . A paper copy in the mail If you have any lab test that is abnormal or we need to change your treatment, we will call you to review the results.   Testing/Procedures: NONE    Follow-Up: At [facility], you and your health needs are our priority.  As part of our continuing mission to provide you with exceptional heart care, we have created designated Provider Care Teams.  These Care Teams include your primary Cardiologist (physician) and Advanced Practice Providers (APPs -  Physician Assistants and Nurse Practitioners) who all work together to provide you with the care you need, when you need it.  We recommend signing up for the patient portal called "MyChart".  Sign up information is provided on this After Visit Summary.  MyChart is used to connect with patients for Virtual Visits (Telemedicine).  Patients are able to view lab/test results, encounter notes, upcoming appointments, etc.  Non-urgent messages can be sent to your provider as well.   To learn more about what you can do with MyChart, go to [email or web address].    Your next appointment:   1 year(s)  The format for your next appointment:   In Person  Provider:   Cristopher Peru, MD   Other Instructions Thank you for choosing [facility]!

## 2020-01-05 NOTE — Progress Notes (Signed)
Remote ICD transmission.   

## 2020-01-05 NOTE — Progress Notes (Signed)
HPI [name] returns today for followup of his ICD. He is a pleasant 79 yo man with a h/o VF arrest, CAD, s/p MI, atrial fib and gout. He is s/p medtronic ICD insertion. In the interim, he notes that he has done well. He does admit to some dietary indiscretion with sodium. No chest pain or sob or edema or syncope. Allergies  Allergen Reactions  . Ezetimibe Other (See Comments)    CAUSES MUSCLE ACHES     Current Outpatient Medications  Medication Sig Dispense Refill  . acetaminophen (TYLENOL) 650 MG CR tablet Take 650-1,300 mg by mouth every 8 (eight) hours as needed for pain.    Marland Kitchen allopurinol (ZYLOPRIM) 300 MG tablet Take 300 mg by mouth daily.    Marland Kitchen atorvastatin (LIPITOR) 20 MG tablet Take 20 mg by mouth every Sunday at 6pm.   3  . COLCRYS 0.6 MG tablet Take 0.6 mg by mouth 2 (two) times daily as needed (gout flare ups).   3  . diphenhydrAMINE (BENADRYL) 25 MG tablet Take 25 mg by mouth every 6 (six) hours as needed (itching/allergies.).    Marland Kitchen ELIQUIS 5 MG TABS tablet TAKE 1 TABLET BY MOUTH TWICE DAILY 180 tablet 0  . gabapentin (NEURONTIN) 300 MG capsule Take 300 mg by mouth 3 (three) times daily.  3  . hydrochlorothiazide (HYDRODIURIL) 25 MG tablet Take 25 mg by mouth daily.    Marland Kitchen HYDROCORTISONE EX Apply 1 application topically 2 (two) times daily as needed (skin irritation/rash).    . insulin NPH-regular Human (NOVOLIN 70/30) (70-30) 100 UNIT/ML injection Inject 48 Units into the skin at bedtime.     Marland Kitchen ketotifen (ALLERGY EYE DROPS) 0.025 % ophthalmic solution Place 1 drop into both eyes 2 (two) times daily as needed (allergy eyes (during Spring & Fall ONLY)).    Marland Kitchen lisinopril (PRINIVIL,ZESTRIL) 40 MG tablet Take 40 mg by mouth every evening.     . metFORMIN (GLUCOPHAGE) 1000 MG tablet Take 1,000 mg by mouth 2 (two) times daily with a meal.    . metoprolol tartrate (LOPRESSOR) 25 MG tablet Take 1 tablet (25 mg total) by mouth 2 (two) times daily. Pt needs to keep upcoming appt in  Oct for further refills 180 tablet 0  . traMADol (ULTRAM) 50 MG tablet Take 50 mg by mouth 3 (three) times daily as needed (back pain.).      No current facility-administered medications for this visit.     Past Medical History:  Diagnosis Date  . CAD (coronary artery disease)    a. RCA PCI in 2003 b. 2008-RCA was occluded; residual CAD- 70% CFX and 50% LAD with normal LVF c. low risk Myoview in Dec 2011  . Diabetes mellitus ([place])   . Dyslipidemia   . GERD (gastroesophageal reflux disease)   . HTN (hypertension)   . Ischemic cardiomyopathy    a. s/p MDT dual chamber ICD 02-2014  . Osteoarthritis     ROS:   All systems reviewed and negative except as noted in the HPI.   Past Surgical History:  Procedure Laterality Date  . CARDIAC CATHETERIZATION  2008   occluded RCA- med Rx  . CATARACT EXTRACTION Right July 2011  . CATARACT EXTRACTION W/PHACO Left 03/25/2019   Procedure: CATARACT EXTRACTION PHACO AND INTRAOCULAR LENS PLACEMENT (IOC);  Surgeon: Baruch Goldmann, MD;  Location: [facility];  Service: Ophthalmology;  Laterality: Left;  CDE: 11.80   . CERVICAL SPINE SURGERY  Feb 2010  . CORONARY  ANGIOPLASTY  2003   RCA PCI  . IMPLANTABLE CARDIOVERTER DEFIBRILLATOR IMPLANT  02-16-2014   MDT Evera dual chamber ICD implanted by Dr Lovena Le  . IMPLANTABLE CARDIOVERTER DEFIBRILLATOR IMPLANT N/A 02/16/2014   Procedure: IMPLANTABLE CARDIOVERTER DEFIBRILLATOR IMPLANT;  Surgeon: Evans Lance, MD;  Location: [facility] CATH LAB;  Service: Cardiovascular;  Laterality: N/A;  . LEFT HEART CATHETERIZATION WITH CORONARY ANGIOGRAM N/A 01/05/2014   Procedure: LEFT HEART CATHETERIZATION WITH CORONARY ANGIOGRAM;  Surgeon: Wellington Hampshire, MD;  Location: [facility] CATH LAB;  Service: Cardiovascular;  Laterality: N/A;     Family History  Problem Relation Age of Onset  . Diabetes Unknown      Social History   Socioeconomic History  . Marital status: Married    Spouse name: Not on file  . Number of children: Not  on file  . Years of education: Not on file  . Highest education level: Not on file  Occupational History  . Not on file  Tobacco Use  . Smoking status: Former Smoker    Quit date: 03/25/1983    Years since quitting: 36.8  . Smokeless tobacco: Former Systems developer    Quit date: 01/04/1983  Vaping Use  . Vaping Use: Never used  Substance and Sexual Activity  . Alcohol use: Yes    Comment: ONCE IN A WHILE   . Drug use: No  . Sexual activity: Yes  Other Topics Concern  . Not on file  Social History Narrative  . Not on file   Social Determinants of Health   Financial Resource Strain:   . Difficulty of Paying Living Expenses: Not on file  Food Insecurity:   . Worried About Charity fundraiser in the Last Year: Not on file  . Ran Out of Food in the Last Year: Not on file  Transportation Needs:   . Lack of Transportation (Medical): Not on file  . Lack of Transportation (Non-Medical): Not on file  Physical Activity:   . Days of Exercise per Week: Not on file  . Minutes of Exercise per Session: Not on file  Stress:   . Feeling of Stress : Not on file  Social Connections:   . Frequency of Communication with Friends and Family: Not on file  . Frequency of Social Gatherings with Friends and Family: Not on file  . Attends Religious Services: Not on file  . Active Member of Clubs or Organizations: Not on file  . Attends Archivist Meetings: Not on file  . Marital Status: Not on file  Intimate Partner Violence:   . Fear of Current or Ex-Partner: Not on file  . Emotionally Abused: Not on file  . Physically Abused: Not on file  . Sexually Abused: Not on file     Ht 6\' 1"  (1.854 m)   Wt 251 lb (113.9 kg)   BMI 33.12 kg/m   Physical Exam:  Well appearing 79 yo man, NAD HEENT: Unremarkable Neck:  6 cm JVD, no thyromegally Lymphatics:  No adenopathy Back:  No CVA tenderness Lungs:  Clear with no wheezes HEART:  Regular rate rhythm, no murmurs, no rubs, no clicks Abd:   soft, positive bowel sounds, no organomegally, no rebound, no guarding Ext:  2 plus pulses, no edema, no cyanosis, no clubbing Skin:  No rashes no nodules Neuro:  CN II through XII intact, motor grossly intact  DEVICE  Normal device function.  See PaceArt for details.   Assess/Plan: 1. VF - he has had no recurrent ventricular  arrhythmias. 2. PAF - he has had a few seconds of atrial fib. He will continue his systemic anti-coagulation. 3. ICD - his medtronic DDD ICD is working normally and has several years of battery longevity left 4. CAD - he is s/p MI. No anginal symptoms.   Carleene Overlie Shigeo Baugh,MD

## 2020-01-11 ENCOUNTER — Other Ambulatory Visit: Payer: Self-pay | Admitting: Internal Medicine

## 2020-01-11 DIAGNOSIS — I480 Paroxysmal atrial fibrillation: Secondary | ICD-10-CM

## 2020-01-18 DIAGNOSIS — K219 Gastro-esophageal reflux disease without esophagitis: Secondary | ICD-10-CM | POA: Diagnosis not present

## 2020-01-18 DIAGNOSIS — Z1329 Encounter for screening for other suspected endocrine disorder: Secondary | ICD-10-CM | POA: Diagnosis not present

## 2020-01-18 DIAGNOSIS — I10 Essential (primary) hypertension: Secondary | ICD-10-CM | POA: Diagnosis not present

## 2020-01-18 DIAGNOSIS — E1142 Type 2 diabetes mellitus with diabetic polyneuropathy: Secondary | ICD-10-CM | POA: Diagnosis not present

## 2020-01-18 DIAGNOSIS — E782 Mixed hyperlipidemia: Secondary | ICD-10-CM | POA: Diagnosis not present

## 2020-01-24 DIAGNOSIS — Z23 Encounter for immunization: Secondary | ICD-10-CM | POA: Diagnosis not present

## 2020-01-24 DIAGNOSIS — I10 Essential (primary) hypertension: Secondary | ICD-10-CM | POA: Diagnosis not present

## 2020-01-24 DIAGNOSIS — E782 Mixed hyperlipidemia: Secondary | ICD-10-CM | POA: Diagnosis not present

## 2020-01-24 DIAGNOSIS — Z6835 Body mass index (BMI) 35.0-35.9, adult: Secondary | ICD-10-CM | POA: Diagnosis not present

## 2020-01-24 DIAGNOSIS — E1142 Type 2 diabetes mellitus with diabetic polyneuropathy: Secondary | ICD-10-CM | POA: Diagnosis not present

## 2020-01-24 DIAGNOSIS — I2510 Atherosclerotic heart disease of native coronary artery without angina pectoris: Secondary | ICD-10-CM | POA: Diagnosis not present

## 2020-03-08 DIAGNOSIS — M170 Bilateral primary osteoarthritis of knee: Secondary | ICD-10-CM | POA: Diagnosis not present

## 2020-03-22 DIAGNOSIS — L820 Inflamed seborrheic keratosis: Secondary | ICD-10-CM | POA: Diagnosis not present

## 2020-03-22 DIAGNOSIS — Z9189 Other specified personal risk factors, not elsewhere classified: Secondary | ICD-10-CM | POA: Diagnosis not present

## 2020-03-31 ENCOUNTER — Ambulatory Visit (INDEPENDENT_AMBULATORY_CARE_PROVIDER_SITE_OTHER): Payer: Medicare Other

## 2020-03-31 DIAGNOSIS — I472 Ventricular tachycardia: Secondary | ICD-10-CM | POA: Diagnosis not present

## 2020-03-31 DIAGNOSIS — I4729 Other ventricular tachycardia: Secondary | ICD-10-CM

## 2020-04-03 LAB — CUP PACEART REMOTE DEVICE CHECK
Battery Remaining Longevity: 32 mo
Battery Voltage: 2.95 V
Brady Statistic AP VP Percent: 0.02 %
Brady Statistic AP VS Percent: 20.93 %
Brady Statistic AS VP Percent: 0.05 %
Brady Statistic AS VS Percent: 79 %
Brady Statistic RA Percent Paced: 20.64 %
Brady Statistic RV Percent Paced: 0.07 %
Date Time Interrogation Session: 20220114124821
HighPow Impedance: 80 Ohm
Implantable Lead Implant Date: 20151202
Implantable Lead Implant Date: 20151202
Implantable Lead Location: 753859
Implantable Lead Location: 753860
Implantable Lead Model: 5076
Implantable Lead Model: 6935
Implantable Pulse Generator Implant Date: 20151202
Lead Channel Impedance Value: 380 Ohm
Lead Channel Impedance Value: 399 Ohm
Lead Channel Impedance Value: 399 Ohm
Lead Channel Pacing Threshold Amplitude: 0.5 V
Lead Channel Pacing Threshold Amplitude: 0.75 V
Lead Channel Pacing Threshold Pulse Width: 0.4 ms
Lead Channel Pacing Threshold Pulse Width: 0.4 ms
Lead Channel Sensing Intrinsic Amplitude: 2.5 mV
Lead Channel Sensing Intrinsic Amplitude: 2.5 mV
Lead Channel Sensing Intrinsic Amplitude: 8.5 mV
Lead Channel Sensing Intrinsic Amplitude: 8.5 mV
Lead Channel Setting Pacing Amplitude: 1.5 V
Lead Channel Setting Pacing Amplitude: 2 V
Lead Channel Setting Pacing Pulse Width: 0.4 ms
Lead Channel Setting Sensing Sensitivity: 0.3 mV

## 2020-04-10 ENCOUNTER — Other Ambulatory Visit: Payer: Self-pay | Admitting: Internal Medicine

## 2020-04-15 NOTE — Progress Notes (Signed)
Remote ICD transmission.   

## 2020-05-18 DIAGNOSIS — I10 Essential (primary) hypertension: Secondary | ICD-10-CM | POA: Diagnosis not present

## 2020-05-18 DIAGNOSIS — E1142 Type 2 diabetes mellitus with diabetic polyneuropathy: Secondary | ICD-10-CM | POA: Diagnosis not present

## 2020-05-18 DIAGNOSIS — E7849 Other hyperlipidemia: Secondary | ICD-10-CM | POA: Diagnosis not present

## 2020-05-18 DIAGNOSIS — E782 Mixed hyperlipidemia: Secondary | ICD-10-CM | POA: Diagnosis not present

## 2020-05-18 DIAGNOSIS — Z1329 Encounter for screening for other suspected endocrine disorder: Secondary | ICD-10-CM | POA: Diagnosis not present

## 2020-05-18 DIAGNOSIS — K2100 Gastro-esophageal reflux disease with esophagitis, without bleeding: Secondary | ICD-10-CM | POA: Diagnosis not present

## 2020-05-22 DIAGNOSIS — I10 Essential (primary) hypertension: Secondary | ICD-10-CM | POA: Diagnosis not present

## 2020-05-22 DIAGNOSIS — E1142 Type 2 diabetes mellitus with diabetic polyneuropathy: Secondary | ICD-10-CM | POA: Diagnosis not present

## 2020-05-22 DIAGNOSIS — R4582 Worries: Secondary | ICD-10-CM | POA: Diagnosis not present

## 2020-05-22 DIAGNOSIS — M1711 Unilateral primary osteoarthritis, right knee: Secondary | ICD-10-CM | POA: Diagnosis not present

## 2020-05-22 DIAGNOSIS — I25111 Atherosclerotic heart disease of native coronary artery with angina pectoris with documented spasm: Secondary | ICD-10-CM | POA: Diagnosis not present

## 2020-05-22 DIAGNOSIS — K2100 Gastro-esophageal reflux disease with esophagitis, without bleeding: Secondary | ICD-10-CM | POA: Diagnosis not present

## 2020-05-22 DIAGNOSIS — E7849 Other hyperlipidemia: Secondary | ICD-10-CM | POA: Diagnosis not present

## 2020-06-22 DIAGNOSIS — M170 Bilateral primary osteoarthritis of knee: Secondary | ICD-10-CM | POA: Diagnosis not present

## 2020-06-30 ENCOUNTER — Ambulatory Visit (INDEPENDENT_AMBULATORY_CARE_PROVIDER_SITE_OTHER): Payer: Medicare Other

## 2020-06-30 DIAGNOSIS — I472 Ventricular tachycardia: Secondary | ICD-10-CM

## 2020-06-30 DIAGNOSIS — I4729 Other ventricular tachycardia: Secondary | ICD-10-CM

## 2020-07-04 LAB — CUP PACEART REMOTE DEVICE CHECK
Battery Remaining Longevity: 34 mo
Battery Voltage: 2.94 V
Brady Statistic AP VP Percent: 0.03 %
Brady Statistic AP VS Percent: 15.71 %
Brady Statistic AS VP Percent: 0.06 %
Brady Statistic AS VS Percent: 84.21 %
Brady Statistic RA Percent Paced: 15.4 %
Brady Statistic RV Percent Paced: 0.08 %
Date Time Interrogation Session: 20220415162924
HighPow Impedance: 74 Ohm
Implantable Lead Implant Date: 20151202
Implantable Lead Implant Date: 20151202
Implantable Lead Location: 753859
Implantable Lead Location: 753860
Implantable Lead Model: 5076
Implantable Lead Model: 6935
Implantable Pulse Generator Implant Date: 20151202
Lead Channel Impedance Value: 342 Ohm
Lead Channel Impedance Value: 380 Ohm
Lead Channel Impedance Value: 399 Ohm
Lead Channel Pacing Threshold Amplitude: 0.5 V
Lead Channel Pacing Threshold Amplitude: 0.875 V
Lead Channel Pacing Threshold Pulse Width: 0.4 ms
Lead Channel Pacing Threshold Pulse Width: 0.4 ms
Lead Channel Sensing Intrinsic Amplitude: 2.375 mV
Lead Channel Sensing Intrinsic Amplitude: 2.375 mV
Lead Channel Sensing Intrinsic Amplitude: 8.25 mV
Lead Channel Sensing Intrinsic Amplitude: 8.25 mV
Lead Channel Setting Pacing Amplitude: 1.5 V
Lead Channel Setting Pacing Amplitude: 2 V
Lead Channel Setting Pacing Pulse Width: 0.4 ms
Lead Channel Setting Sensing Sensitivity: 0.3 mV

## 2020-07-11 DIAGNOSIS — Z7984 Long term (current) use of oral hypoglycemic drugs: Secondary | ICD-10-CM | POA: Diagnosis not present

## 2020-07-11 DIAGNOSIS — Z961 Presence of intraocular lens: Secondary | ICD-10-CM | POA: Diagnosis not present

## 2020-07-11 DIAGNOSIS — H524 Presbyopia: Secondary | ICD-10-CM | POA: Diagnosis not present

## 2020-07-11 DIAGNOSIS — E113293 Type 2 diabetes mellitus with mild nonproliferative diabetic retinopathy without macular edema, bilateral: Secondary | ICD-10-CM | POA: Diagnosis not present

## 2020-07-11 DIAGNOSIS — H16223 Keratoconjunctivitis sicca, not specified as Sjogren's, bilateral: Secondary | ICD-10-CM | POA: Diagnosis not present

## 2020-07-17 NOTE — Progress Notes (Signed)
Remote ICD transmission.   

## 2020-09-21 DIAGNOSIS — M170 Bilateral primary osteoarthritis of knee: Secondary | ICD-10-CM | POA: Diagnosis not present

## 2020-09-24 DIAGNOSIS — Z20822 Contact with and (suspected) exposure to covid-19: Secondary | ICD-10-CM | POA: Diagnosis not present

## 2020-09-29 ENCOUNTER — Ambulatory Visit (INDEPENDENT_AMBULATORY_CARE_PROVIDER_SITE_OTHER): Payer: Medicare Other

## 2020-09-29 DIAGNOSIS — I472 Ventricular tachycardia: Secondary | ICD-10-CM | POA: Diagnosis not present

## 2020-09-29 DIAGNOSIS — I4729 Other ventricular tachycardia: Secondary | ICD-10-CM

## 2020-09-29 LAB — CUP PACEART REMOTE DEVICE CHECK
Battery Remaining Longevity: 32 mo
Battery Voltage: 2.94 V
Brady Statistic AP VP Percent: 0.02 %
Brady Statistic AP VS Percent: 7.63 %
Brady Statistic AS VP Percent: 0.06 %
Brady Statistic AS VS Percent: 92.29 %
Brady Statistic RA Percent Paced: 7.59 %
Brady Statistic RV Percent Paced: 0.08 %
Date Time Interrogation Session: 20220715104014
HighPow Impedance: 79 Ohm
Implantable Lead Implant Date: 20151202
Implantable Lead Implant Date: 20151202
Implantable Lead Location: 753859
Implantable Lead Location: 753860
Implantable Lead Model: 5076
Implantable Lead Model: 6935
Implantable Pulse Generator Implant Date: 20151202
Lead Channel Impedance Value: 323 Ohm
Lead Channel Impedance Value: 342 Ohm
Lead Channel Impedance Value: 399 Ohm
Lead Channel Pacing Threshold Amplitude: 0.5 V
Lead Channel Pacing Threshold Amplitude: 0.75 V
Lead Channel Pacing Threshold Pulse Width: 0.4 ms
Lead Channel Pacing Threshold Pulse Width: 0.4 ms
Lead Channel Sensing Intrinsic Amplitude: 2.25 mV
Lead Channel Sensing Intrinsic Amplitude: 2.25 mV
Lead Channel Sensing Intrinsic Amplitude: 8.75 mV
Lead Channel Sensing Intrinsic Amplitude: 8.75 mV
Lead Channel Setting Pacing Amplitude: 1.5 V
Lead Channel Setting Pacing Amplitude: 2 V
Lead Channel Setting Pacing Pulse Width: 0.4 ms
Lead Channel Setting Sensing Sensitivity: 0.3 mV

## 2020-10-09 DIAGNOSIS — E782 Mixed hyperlipidemia: Secondary | ICD-10-CM | POA: Diagnosis not present

## 2020-10-09 DIAGNOSIS — I10 Essential (primary) hypertension: Secondary | ICD-10-CM | POA: Diagnosis not present

## 2020-10-09 DIAGNOSIS — Z1329 Encounter for screening for other suspected endocrine disorder: Secondary | ICD-10-CM | POA: Diagnosis not present

## 2020-10-09 DIAGNOSIS — K2100 Gastro-esophageal reflux disease with esophagitis, without bleeding: Secondary | ICD-10-CM | POA: Diagnosis not present

## 2020-10-09 DIAGNOSIS — E1165 Type 2 diabetes mellitus with hyperglycemia: Secondary | ICD-10-CM | POA: Diagnosis not present

## 2020-10-09 DIAGNOSIS — Z1159 Encounter for screening for other viral diseases: Secondary | ICD-10-CM | POA: Diagnosis not present

## 2020-10-09 DIAGNOSIS — E7849 Other hyperlipidemia: Secondary | ICD-10-CM | POA: Diagnosis not present

## 2020-10-12 DIAGNOSIS — E1142 Type 2 diabetes mellitus with diabetic polyneuropathy: Secondary | ICD-10-CM | POA: Diagnosis not present

## 2020-10-12 DIAGNOSIS — I25111 Atherosclerotic heart disease of native coronary artery with angina pectoris with documented spasm: Secondary | ICD-10-CM | POA: Diagnosis not present

## 2020-10-12 DIAGNOSIS — I10 Essential (primary) hypertension: Secondary | ICD-10-CM | POA: Diagnosis not present

## 2020-10-12 DIAGNOSIS — M5030 Other cervical disc degeneration, unspecified cervical region: Secondary | ICD-10-CM | POA: Diagnosis not present

## 2020-10-12 DIAGNOSIS — Z0001 Encounter for general adult medical examination with abnormal findings: Secondary | ICD-10-CM | POA: Diagnosis not present

## 2020-10-12 DIAGNOSIS — R4582 Worries: Secondary | ICD-10-CM | POA: Diagnosis not present

## 2020-10-12 DIAGNOSIS — E7849 Other hyperlipidemia: Secondary | ICD-10-CM | POA: Diagnosis not present

## 2020-10-23 NOTE — Progress Notes (Signed)
Remote ICD transmission.   

## 2020-12-22 DIAGNOSIS — M170 Bilateral primary osteoarthritis of knee: Secondary | ICD-10-CM | POA: Diagnosis not present

## 2020-12-25 ENCOUNTER — Other Ambulatory Visit: Payer: Self-pay | Admitting: Internal Medicine

## 2020-12-26 NOTE — Telephone Encounter (Signed)
This is a [place] pt.  °

## 2020-12-29 ENCOUNTER — Ambulatory Visit (INDEPENDENT_AMBULATORY_CARE_PROVIDER_SITE_OTHER): Payer: Medicare Other

## 2020-12-29 DIAGNOSIS — I4729 Other ventricular tachycardia: Secondary | ICD-10-CM | POA: Diagnosis not present

## 2021-01-02 LAB — CUP PACEART REMOTE DEVICE CHECK
Battery Remaining Longevity: 29 mo
Battery Voltage: 2.94 V
Brady Statistic AP VP Percent: 0.01 %
Brady Statistic AP VS Percent: 4.31 %
Brady Statistic AS VP Percent: 0.06 %
Brady Statistic AS VS Percent: 95.62 %
Brady Statistic RA Percent Paced: 4.3 %
Brady Statistic RV Percent Paced: 0.07 %
Date Time Interrogation Session: 20221014073730
HighPow Impedance: 86 Ohm
Implantable Lead Implant Date: 20151202
Implantable Lead Implant Date: 20151202
Implantable Lead Location: 753859
Implantable Lead Location: 753860
Implantable Lead Model: 5076
Implantable Lead Model: 6935
Implantable Pulse Generator Implant Date: 20151202
Lead Channel Impedance Value: 342 Ohm
Lead Channel Impedance Value: 399 Ohm
Lead Channel Impedance Value: 437 Ohm
Lead Channel Pacing Threshold Amplitude: 0.5 V
Lead Channel Pacing Threshold Amplitude: 0.75 V
Lead Channel Pacing Threshold Pulse Width: 0.4 ms
Lead Channel Pacing Threshold Pulse Width: 0.4 ms
Lead Channel Sensing Intrinsic Amplitude: 3 mV
Lead Channel Sensing Intrinsic Amplitude: 3 mV
Lead Channel Sensing Intrinsic Amplitude: 9 mV
Lead Channel Sensing Intrinsic Amplitude: 9 mV
Lead Channel Setting Pacing Amplitude: 1.5 V
Lead Channel Setting Pacing Amplitude: 2 V
Lead Channel Setting Pacing Pulse Width: 0.4 ms
Lead Channel Setting Sensing Sensitivity: 0.3 mV

## 2021-01-05 NOTE — Progress Notes (Signed)
Remote ICD transmission.   

## 2021-01-08 DIAGNOSIS — K219 Gastro-esophageal reflux disease without esophagitis: Secondary | ICD-10-CM | POA: Diagnosis not present

## 2021-01-08 DIAGNOSIS — E1142 Type 2 diabetes mellitus with diabetic polyneuropathy: Secondary | ICD-10-CM | POA: Diagnosis not present

## 2021-01-08 DIAGNOSIS — E782 Mixed hyperlipidemia: Secondary | ICD-10-CM | POA: Diagnosis not present

## 2021-01-08 DIAGNOSIS — E7849 Other hyperlipidemia: Secondary | ICD-10-CM | POA: Diagnosis not present

## 2021-01-08 DIAGNOSIS — E1165 Type 2 diabetes mellitus with hyperglycemia: Secondary | ICD-10-CM | POA: Diagnosis not present

## 2021-01-09 ENCOUNTER — Ambulatory Visit (INDEPENDENT_AMBULATORY_CARE_PROVIDER_SITE_OTHER): Payer: Medicare Other | Admitting: Internal Medicine

## 2021-01-09 ENCOUNTER — Encounter: Payer: Self-pay | Admitting: Internal Medicine

## 2021-01-09 ENCOUNTER — Other Ambulatory Visit: Payer: Self-pay

## 2021-01-09 VITALS — BP 122/60 | HR 67 | Ht 72.0 in | Wt 236.0 lb

## 2021-01-09 DIAGNOSIS — I4901 Ventricular fibrillation: Secondary | ICD-10-CM

## 2021-01-09 DIAGNOSIS — I10 Essential (primary) hypertension: Secondary | ICD-10-CM

## 2021-01-09 NOTE — Progress Notes (Signed)
HPI [name] returns today for followup of his ICD. He is a pleasant 80 yo man with a h/o VF arrest, CAD, s/p MI, atrial fib and gout. He is s/p medtronic ICD insertion. In the interim, he notes that he has done well. He does admit to some dietary indiscretion with sodium. No chest pain or sob or edema or syncope. He has had some difficulty staying asleep. He has not had any ICD therapies. Allergies  Allergen Reactions   Ezetimibe Other (See Comments)    CAUSES MUSCLE ACHES     Current Outpatient Medications  Medication Sig Dispense Refill   acetaminophen (TYLENOL) 650 MG CR tablet Take 650-1,300 mg by mouth every 8 (eight) hours as needed for pain.     allopurinol (ZYLOPRIM) 300 MG tablet Take 300 mg by mouth daily.     atorvastatin (LIPITOR) 20 MG tablet Take 20 mg by mouth every Sunday at 6pm.   3   COLCRYS 0.6 MG tablet Take 0.6 mg by mouth 2 (two) times daily as needed (gout flare ups).   3   diphenhydrAMINE (BENADRYL) 25 MG tablet Take 25 mg by mouth every 6 (six) hours as needed (itching/allergies.).     ELIQUIS 5 MG TABS tablet TAKE 1 TABLET BY MOUTH TWICE DAILY 180 tablet 3   gabapentin (NEURONTIN) 300 MG capsule Take 300 mg by mouth 3 (three) times daily.  3   hydrochlorothiazide (HYDRODIURIL) 25 MG tablet Take 25 mg by mouth daily.     HYDROCORTISONE EX Apply 1 application topically 2 (two) times daily as needed (skin irritation/rash).     insulin NPH-regular Human (NOVOLIN 70/30) (70-30) 100 UNIT/ML injection Inject 55 Units into the skin at bedtime.      ketotifen (ZADITOR) 0.025 % ophthalmic solution Place 1 drop into both eyes 2 (two) times daily as needed (allergy eyes (during Spring & Fall ONLY)).     lisinopril (PRINIVIL,ZESTRIL) 40 MG tablet Take 40 mg by mouth every evening.      metFORMIN (GLUCOPHAGE) 1000 MG tablet Take 1,000 mg by mouth 2 (two) times daily with a meal.     metoprolol tartrate (LOPRESSOR) 25 MG tablet TAKE 1 TABLET BY MOUTH TWICE DAILY,  PATIENT MUST KEEP APPONTMENT IN OCTOBER FOR FURTHER REFILLS 180 tablet 2   traMADol (ULTRAM) 50 MG tablet Take 50 mg by mouth 3 (three) times daily as needed (back pain.).      No current facility-administered medications for this visit.     Past Medical History:  Diagnosis Date   CAD (coronary artery disease)    a. RCA PCI in 2003 b. 2008-RCA was occluded; residual CAD- 70% CFX and 50% LAD with normal LVF c. low risk Myoview in Dec 2011   Diabetes mellitus ([place])    Dyslipidemia    GERD (gastroesophageal reflux disease)    HTN (hypertension)    Ischemic cardiomyopathy    a. s/p MDT dual chamber ICD 02-2014   Osteoarthritis     ROS:   All systems reviewed and negative except as noted in the HPI.   Past Surgical History:  Procedure Laterality Date   CARDIAC CATHETERIZATION  2008   occluded RCA- med Rx   CATARACT EXTRACTION Right July 2011   CATARACT EXTRACTION W/PHACO Left 03/25/2019   Procedure: CATARACT EXTRACTION PHACO AND INTRAOCULAR LENS PLACEMENT (IOC);  Surgeon: Baruch Goldmann, MD;  Location: [facility];  Service: Ophthalmology;  Laterality: Left;  CDE: 11.80    CERVICAL SPINE SURGERY  Feb 2010   CORONARY ANGIOPLASTY  2003   RCA PCI   IMPLANTABLE CARDIOVERTER DEFIBRILLATOR IMPLANT  02-16-2014   MDT Evera dual chamber ICD implanted by Dr Lovena Le   IMPLANTABLE CARDIOVERTER DEFIBRILLATOR IMPLANT N/A 02/16/2014   Procedure: IMPLANTABLE CARDIOVERTER DEFIBRILLATOR IMPLANT;  Surgeon: Evans Lance, MD;  Location: [facility] CATH LAB;  Service: Cardiovascular;  Laterality: N/A;   LEFT HEART CATHETERIZATION WITH CORONARY ANGIOGRAM N/A 01/05/2014   Procedure: LEFT HEART CATHETERIZATION WITH CORONARY ANGIOGRAM;  Surgeon: Wellington Hampshire, MD;  Location: [facility] CATH LAB;  Service: Cardiovascular;  Laterality: N/A;     Family History  Problem Relation Age of Onset   Diabetes Mother    Cancer - Prostate Father    Diabetes Other      Social History   Socioeconomic History   Marital status:  Married    Spouse name: Not on file   Number of children: Not on file   Years of education: Not on file   Highest education level: Not on file  Occupational History   Not on file  Tobacco Use   Smoking status: Former    Types: Cigarettes    Quit date: 03/25/1983    Years since quitting: 37.8   Smokeless tobacco: Former    Quit date: 01/04/1983  Vaping Use   Vaping Use: Never used  Substance and Sexual Activity   Alcohol use: Yes    Comment: ONCE IN A WHILE    Drug use: No   Sexual activity: Yes  Other Topics Concern   Not on file  Social History Narrative   Not on file   Social Determinants of Health   Financial Resource Strain: Not on file  Food Insecurity: Not on file  Transportation Needs: Not on file  Physical Activity: Not on file  Stress: Not on file  Social Connections: Not on file  Intimate Partner Violence: Not on file     BP 122/60   Pulse 67   Ht 6' (1.829 m)   Wt 236 lb (107 kg)   SpO2 94%   BMI 32.01 kg/m   Physical Exam:  Well appearing NAD HEENT: Unremarkable Neck:  No JVD, no thyromegally Lymphatics:  No adenopathy Back:  No CVA tenderness Lungs:  Clear with no wheezes HEART:  Regular rate rhythm, no murmurs, no rubs, no clicks Abd:  soft, positive bowel sounds, no organomegally, no rebound, no guarding Ext:  2 plus pulses, no edema, no cyanosis, no clubbing Skin:  No rashes no nodules Neuro:  CN II through XII intact, motor grossly intact  EKG - nsr with rare PVC's  DEVICE  Normal device function.  See PaceArt for details.   Assess/Plan:  1. VF - he has had no recurrent ventricular arrhythmias. 2. PAF - he has had a few seconds of atrial fib. He will continue his systemic anti-coagulation. 3. ICD - his medtronic DDD ICD is working normally and has several years of battery longevity left 4. CAD - he is s/p MI. No anginal symptoms.    Carleene Overlie Maclean Foister,MD

## 2021-01-09 NOTE — Patient Instructions (Signed)
Medication Instructions:  Your physician recommends that you continue on your current medications as directed. Please refer to the Current Medication list given to you today.  *If you need a refill on your cardiac medications before your next appointment, please call your pharmacy*   Lab Work: NONE   If you have labs (blood work) drawn today and your tests are completely normal, you will receive your results only by: . MyChart Message (if you have MyChart) OR . A paper copy in the mail If you have any lab test that is abnormal or we need to change your treatment, we will call you to review the results.   Testing/Procedures: NONE    Follow-Up: At [facility], you and your health needs are our priority.  As part of our continuing mission to provide you with exceptional heart care, we have created designated Provider Care Teams.  These Care Teams include your primary Cardiologist (physician) and Advanced Practice Providers (APPs -  Physician Assistants and Nurse Practitioners) who all work together to provide you with the care you need, when you need it.  We recommend signing up for the patient portal called "MyChart".  Sign up information is provided on this After Visit Summary.  MyChart is used to connect with patients for Virtual Visits (Telemedicine).  Patients are able to view lab/test results, encounter notes, upcoming appointments, etc.  Non-urgent messages can be sent to your provider as well.   To learn more about what you can do with MyChart, go to [email or web address].    Your next appointment:   1 year(s)  The format for your next appointment:   In Person  Provider:   Gregg Taylor, MD   Other Instructions Thank you for choosing [facility]!    

## 2021-01-10 DIAGNOSIS — K2100 Gastro-esophageal reflux disease with esophagitis, without bleeding: Secondary | ICD-10-CM | POA: Diagnosis not present

## 2021-01-10 DIAGNOSIS — Z7189 Other specified counseling: Secondary | ICD-10-CM | POA: Diagnosis not present

## 2021-01-10 DIAGNOSIS — Z23 Encounter for immunization: Secondary | ICD-10-CM | POA: Diagnosis not present

## 2021-01-10 DIAGNOSIS — E7849 Other hyperlipidemia: Secondary | ICD-10-CM | POA: Diagnosis not present

## 2021-01-10 DIAGNOSIS — I10 Essential (primary) hypertension: Secondary | ICD-10-CM | POA: Diagnosis not present

## 2021-01-10 DIAGNOSIS — E1142 Type 2 diabetes mellitus with diabetic polyneuropathy: Secondary | ICD-10-CM | POA: Diagnosis not present

## 2021-01-10 DIAGNOSIS — I25119 Atherosclerotic heart disease of native coronary artery with unspecified angina pectoris: Secondary | ICD-10-CM | POA: Diagnosis not present

## 2021-01-22 DIAGNOSIS — L570 Actinic keratosis: Secondary | ICD-10-CM | POA: Diagnosis not present

## 2021-01-22 DIAGNOSIS — L299 Pruritus, unspecified: Secondary | ICD-10-CM | POA: Diagnosis not present

## 2021-01-22 DIAGNOSIS — L218 Other seborrheic dermatitis: Secondary | ICD-10-CM | POA: Diagnosis not present

## 2021-02-15 ENCOUNTER — Other Ambulatory Visit: Payer: Self-pay | Admitting: Internal Medicine

## 2021-02-15 DIAGNOSIS — I480 Paroxysmal atrial fibrillation: Secondary | ICD-10-CM

## 2021-02-15 NOTE — Telephone Encounter (Signed)
Prescription refill request for Eliquis received. Indication: Atrial Fib Last office visit: 01/09/21  Beckie Salts MD Scr: 1.13 on 01/08/21 Age:  80 Weight: 107kg  Based on above findings Eliquis 5mg  twice daily is the appropriate dose.  Refill approved.

## 2021-02-22 DIAGNOSIS — L218 Other seborrheic dermatitis: Secondary | ICD-10-CM | POA: Diagnosis not present

## 2021-03-06 DIAGNOSIS — Z20828 Contact with and (suspected) exposure to other viral communicable diseases: Secondary | ICD-10-CM | POA: Diagnosis not present

## 2021-03-06 DIAGNOSIS — R509 Fever, unspecified: Secondary | ICD-10-CM | POA: Diagnosis not present

## 2021-03-06 DIAGNOSIS — J101 Influenza due to other identified influenza virus with other respiratory manifestations: Secondary | ICD-10-CM | POA: Diagnosis not present

## 2021-03-12 DIAGNOSIS — J329 Chronic sinusitis, unspecified: Secondary | ICD-10-CM | POA: Diagnosis not present

## 2021-03-26 DIAGNOSIS — M170 Bilateral primary osteoarthritis of knee: Secondary | ICD-10-CM | POA: Diagnosis not present

## 2021-03-30 ENCOUNTER — Ambulatory Visit (INDEPENDENT_AMBULATORY_CARE_PROVIDER_SITE_OTHER): Payer: Medicare Other

## 2021-03-30 DIAGNOSIS — I4901 Ventricular fibrillation: Secondary | ICD-10-CM | POA: Diagnosis not present

## 2021-04-01 LAB — CUP PACEART REMOTE DEVICE CHECK
Battery Remaining Longevity: 23 mo
Battery Voltage: 2.93 V
Brady Statistic AP VP Percent: 0.01 %
Brady Statistic AP VS Percent: 8.32 %
Brady Statistic AS VP Percent: 0.04 %
Brady Statistic AS VS Percent: 91.63 %
Brady Statistic RA Percent Paced: 8.29 %
Brady Statistic RV Percent Paced: 0.05 %
Date Time Interrogation Session: 20230114185111
HighPow Impedance: 83 Ohm
Implantable Lead Implant Date: 20151202
Implantable Lead Implant Date: 20151202
Implantable Lead Location: 753859
Implantable Lead Location: 753860
Implantable Lead Model: 5076
Implantable Lead Model: 6935
Implantable Pulse Generator Implant Date: 20151202
Lead Channel Impedance Value: 342 Ohm
Lead Channel Impedance Value: 399 Ohm
Lead Channel Impedance Value: 437 Ohm
Lead Channel Pacing Threshold Amplitude: 0.5 V
Lead Channel Pacing Threshold Amplitude: 0.75 V
Lead Channel Pacing Threshold Pulse Width: 0.4 ms
Lead Channel Pacing Threshold Pulse Width: 0.4 ms
Lead Channel Sensing Intrinsic Amplitude: 2.25 mV
Lead Channel Sensing Intrinsic Amplitude: 2.25 mV
Lead Channel Sensing Intrinsic Amplitude: 8 mV
Lead Channel Sensing Intrinsic Amplitude: 8 mV
Lead Channel Setting Pacing Amplitude: 1.5 V
Lead Channel Setting Pacing Amplitude: 2 V
Lead Channel Setting Pacing Pulse Width: 0.4 ms
Lead Channel Setting Sensing Sensitivity: 0.3 mV

## 2021-04-10 NOTE — Progress Notes (Signed)
Remote ICD transmission.   

## 2021-05-10 DIAGNOSIS — E1142 Type 2 diabetes mellitus with diabetic polyneuropathy: Secondary | ICD-10-CM | POA: Diagnosis not present

## 2021-05-10 DIAGNOSIS — Z1329 Encounter for screening for other suspected endocrine disorder: Secondary | ICD-10-CM | POA: Diagnosis not present

## 2021-05-10 DIAGNOSIS — I482 Chronic atrial fibrillation, unspecified: Secondary | ICD-10-CM | POA: Diagnosis not present

## 2021-05-10 DIAGNOSIS — E782 Mixed hyperlipidemia: Secondary | ICD-10-CM | POA: Diagnosis not present

## 2021-05-10 DIAGNOSIS — I10 Essential (primary) hypertension: Secondary | ICD-10-CM | POA: Diagnosis not present

## 2021-05-10 DIAGNOSIS — K2100 Gastro-esophageal reflux disease with esophagitis, without bleeding: Secondary | ICD-10-CM | POA: Diagnosis not present

## 2021-05-10 DIAGNOSIS — E7849 Other hyperlipidemia: Secondary | ICD-10-CM | POA: Diagnosis not present

## 2021-05-14 DIAGNOSIS — R4582 Worries: Secondary | ICD-10-CM | POA: Diagnosis not present

## 2021-05-14 DIAGNOSIS — M5030 Other cervical disc degeneration, unspecified cervical region: Secondary | ICD-10-CM | POA: Diagnosis not present

## 2021-05-14 DIAGNOSIS — E1142 Type 2 diabetes mellitus with diabetic polyneuropathy: Secondary | ICD-10-CM | POA: Diagnosis not present

## 2021-05-14 DIAGNOSIS — Z23 Encounter for immunization: Secondary | ICD-10-CM | POA: Diagnosis not present

## 2021-05-14 DIAGNOSIS — E7849 Other hyperlipidemia: Secondary | ICD-10-CM | POA: Diagnosis not present

## 2021-05-14 DIAGNOSIS — I10 Essential (primary) hypertension: Secondary | ICD-10-CM | POA: Diagnosis not present

## 2021-05-14 DIAGNOSIS — I25119 Atherosclerotic heart disease of native coronary artery with unspecified angina pectoris: Secondary | ICD-10-CM | POA: Diagnosis not present

## 2021-05-17 DIAGNOSIS — Z20822 Contact with and (suspected) exposure to covid-19: Secondary | ICD-10-CM | POA: Diagnosis not present

## 2021-05-17 DIAGNOSIS — Z20828 Contact with and (suspected) exposure to other viral communicable diseases: Secondary | ICD-10-CM | POA: Diagnosis not present

## 2021-06-19 DIAGNOSIS — Z20822 Contact with and (suspected) exposure to covid-19: Secondary | ICD-10-CM | POA: Diagnosis not present

## 2021-06-21 DIAGNOSIS — L280 Lichen simplex chronicus: Secondary | ICD-10-CM | POA: Diagnosis not present

## 2021-06-21 DIAGNOSIS — L218 Other seborrheic dermatitis: Secondary | ICD-10-CM | POA: Diagnosis not present

## 2021-06-29 ENCOUNTER — Ambulatory Visit (INDEPENDENT_AMBULATORY_CARE_PROVIDER_SITE_OTHER): Payer: Medicare Other

## 2021-06-29 DIAGNOSIS — I4901 Ventricular fibrillation: Secondary | ICD-10-CM

## 2021-06-29 LAB — CUP PACEART REMOTE DEVICE CHECK
Battery Remaining Longevity: 27 mo
Battery Voltage: 2.93 V
Brady Statistic AP VP Percent: 0.01 %
Brady Statistic AP VS Percent: 6.69 %
Brady Statistic AS VP Percent: 0.05 %
Brady Statistic AS VS Percent: 93.25 %
Brady Statistic RA Percent Paced: 6.67 %
Brady Statistic RV Percent Paced: 0.06 %
Date Time Interrogation Session: 20230414073303
HighPow Impedance: 78 Ohm
Implantable Lead Implant Date: 20151202
Implantable Lead Implant Date: 20151202
Implantable Lead Location: 753859
Implantable Lead Location: 753860
Implantable Lead Model: 5076
Implantable Lead Model: 6935
Implantable Pulse Generator Implant Date: 20151202
Lead Channel Impedance Value: 323 Ohm
Lead Channel Impedance Value: 380 Ohm
Lead Channel Impedance Value: 399 Ohm
Lead Channel Pacing Threshold Amplitude: 0.5 V
Lead Channel Pacing Threshold Amplitude: 0.75 V
Lead Channel Pacing Threshold Pulse Width: 0.4 ms
Lead Channel Pacing Threshold Pulse Width: 0.4 ms
Lead Channel Sensing Intrinsic Amplitude: 2 mV
Lead Channel Sensing Intrinsic Amplitude: 2 mV
Lead Channel Sensing Intrinsic Amplitude: 8.125 mV
Lead Channel Sensing Intrinsic Amplitude: 8.125 mV
Lead Channel Setting Pacing Amplitude: 1.5 V
Lead Channel Setting Pacing Amplitude: 2 V
Lead Channel Setting Pacing Pulse Width: 0.4 ms
Lead Channel Setting Sensing Sensitivity: 0.3 mV

## 2021-07-11 DIAGNOSIS — Z20822 Contact with and (suspected) exposure to covid-19: Secondary | ICD-10-CM | POA: Diagnosis not present

## 2021-07-17 NOTE — Progress Notes (Signed)
Remote ICD transmission.   

## 2021-07-20 DIAGNOSIS — M25561 Pain in right knee: Secondary | ICD-10-CM | POA: Diagnosis not present

## 2021-07-20 DIAGNOSIS — M25562 Pain in left knee: Secondary | ICD-10-CM | POA: Diagnosis not present

## 2021-07-20 DIAGNOSIS — M170 Bilateral primary osteoarthritis of knee: Secondary | ICD-10-CM | POA: Diagnosis not present

## 2021-07-23 DIAGNOSIS — Z20822 Contact with and (suspected) exposure to covid-19: Secondary | ICD-10-CM | POA: Diagnosis not present

## 2021-07-26 ENCOUNTER — Telehealth: Payer: Self-pay | Admitting: *Deleted

## 2021-07-26 NOTE — Telephone Encounter (Signed)
? ?  Pre-operative Risk Assessment  ?  ?Patient Name: Trevor [name]  ?DOB: 04/13/1940 ?MRN: [id number]  ? ?  ? ?Request for Surgical Clearance   ? ?Procedure:   LEFT TOTAL KNEE ARTHROPLASTY ? ?Date of Surgery:  Clearance 09/11/21                              ?   ?Surgeon:  DR. MATTHEW OLIN ?Surgeon's Group or Practice Name:  [facility] ?Phone number:  [phone] ?Fax number:  [phone] ATTN: Orson Slick ?  ?Type of Clearance Requested:   ?- Medical  ?- Pharmacy:  Hold Apixaban (Eliquis) x 3 DAYS PRIOR ?  ?Type of Anesthesia:  Spinal ?  ?Additional requests/questions:   ? ?Signed, ?Julaine Hua   ?07/26/2021, 5:44 PM  ? ?

## 2021-07-27 NOTE — Telephone Encounter (Signed)
Patient with diagnosis of afib on Eliquis for anticoagulation.   ? ?Procedure: left TKA ?Date of procedure: 09/11/21 ? ?CHA2DS2-VASc Score = 5  ?This indicates a 7.2% annual risk of stroke. ?The patient's score is based upon: ?CHF History: 0 ?HTN History: 1 ?Diabetes History: 1 ?Stroke History: 0 ?Vascular Disease History: 1 ?Age Score: 2 ?Gender Score: 0 ?  ?CrCl 81m/min using adjusted body weight due to obesity ?Platelet count 160K ? ?Per office protocol, patient can hold Eliquis for 3 days prior to procedure.   ?

## 2021-07-27 NOTE — Telephone Encounter (Signed)
Preoperative team, please contact this patient and set up a phone call appointment for further cardiac evaluation.  Thank you for your help. ? ?Trevor [name]. Krystol Rocco NP-C ? ?  ?07/27/2021, 1:38 PM ?[facility] ?[address] ?Office [phone] Fax [phone] ? ?

## 2021-07-27 NOTE — Telephone Encounter (Signed)
Left message to call back for tele pre op appt 

## 2021-07-30 NOTE — Telephone Encounter (Signed)
Left message x 2 to call back for tele pre op appt.  

## 2021-07-31 ENCOUNTER — Telehealth: Payer: Self-pay | Admitting: *Deleted

## 2021-07-31 NOTE — Telephone Encounter (Signed)
Pt returning call to speak to pre op. Requesting call back.  ?

## 2021-07-31 NOTE — Telephone Encounter (Signed)
Returned call to the pt today and left message for call back for tele appt ?

## 2021-07-31 NOTE — Telephone Encounter (Signed)
I s/w the pt and his wife and they are agreeable to plan of care for tele pre op appt 08/03/21 @ 11:40 per the pt's wife request. Med rec and consent are done.  ?

## 2021-07-31 NOTE — Telephone Encounter (Signed)
? ?  Pt is returning call, he said his phone is messing up and to call him at his wife's number [phone] ?

## 2021-07-31 NOTE — Telephone Encounter (Signed)
I s/w the pt and his wife and they are agreeable to plan of care for tele pre op appt 08/03/21 @ 11:40 per the pt's wife request. Med rec and consent are done.  ? ?  ?Patient Consent for Virtual Visit  ? ? ?   ? ?[name] has provided verbal consent on 07/31/2021 for a virtual visit (video or telephone). ? ? ?CONSENT FOR VIRTUAL VISIT FOR:  Trevor [name]  ?By participating in this virtual visit I agree to the following: ? ?I hereby voluntarily request, consent and authorize [facility] and its employed or contracted physicians, physician assistants, nurse practitioners or other licensed health care professionals (the Practitioner), to provide me with telemedicine health care services (the ?Services") as deemed necessary by the treating Practitioner. I acknowledge and consent to receive the Services by the Practitioner via telemedicine. I understand that the telemedicine visit will involve communicating with the Practitioner through live audiovisual communication technology and the disclosure of certain medical information by electronic transmission. I acknowledge that I have been given the opportunity to request an in-person assessment or other available alternative prior to the telemedicine visit and am voluntarily participating in the telemedicine visit. ? ?I understand that I have the right to withhold or withdraw my consent to the use of telemedicine in the course of my care at any time, without affecting my right to future care or treatment, and that the Practitioner or I may terminate the telemedicine visit at any time. I understand that I have the right to inspect all information obtained and/or recorded in the course of the telemedicine visit and may receive copies of available information for a reasonable fee.  I understand that some of the potential risks of receiving the Services via telemedicine include:  ?Delay or interruption in medical evaluation due to technological equipment failure or  disruption; ?Information transmitted may not be sufficient (e.g. poor resolution of images) to allow for appropriate medical decision making by the Practitioner; and/or  ?In rare instances, security protocols could fail, causing a breach of personal health information. ? ?Furthermore, I acknowledge that it is my responsibility to provide information about my medical history, conditions and care that is complete and accurate to the best of my ability. I acknowledge that Practitioner's advice, recommendations, and/or decision may be based on factors not within their control, such as incomplete or inaccurate data provided by me or distortions of diagnostic images or specimens that may result from electronic transmissions. I understand that the practice of medicine is not an exact science and that Practitioner makes no warranties or guarantees regarding treatment outcomes. I acknowledge that a copy of this consent can be made available to me via my patient portal ([facility]), or I can request a printed copy by calling the office of [facility].   ? ?I understand that my insurance will be billed for this visit.  ? ?I have read or had this consent read to me. ?I understand the contents of this consent, which adequately explains the benefits and risks of the Services being provided via telemedicine.  ?I have been provided ample opportunity to ask questions regarding this consent and the Services and have had my questions answered to my satisfaction. ?I give my informed consent for the services to be provided through the use of telemedicine in my medical care ? ? ? ?

## 2021-08-03 ENCOUNTER — Telehealth: Payer: Medicare Other

## 2021-08-03 ENCOUNTER — Telehealth: Payer: Self-pay | Admitting: Physician Assistant

## 2021-08-03 NOTE — Telephone Encounter (Signed)
S/w the pt's wife and explained per Melina Copa, [facility] would prefer the pt to have in office appt o be able to better assess the pt due to complicated history. Pt's wife is agreeable and has scheduled for in office appt to see Tommye Standard, [facility] on 08/16/21 @ 10:05. [place] office did not have anything available before planned procedure. I will forward notes to [facility] for upcoming appt. Will send FYI to requesting office pt has in office appt 08/16/21 for pre op assessment.

## 2021-08-03 NOTE — Telephone Encounter (Signed)
S/w the pt's wife and explained per Melina Copa, [facility] would prefer the pt to have in office appt o be able to better assess the pt due to complicated history. Pt's wife is agreeable and has scheduled for in office appt to see Tommye Standard, [facility] at [facility] on 08/16/21 @ 10:05. [place] office did not have anything available before planned procedure. I will forward notes to [facility] for upcoming appt. Will send FYI to requesting office pt has in office appt 08/16/21 for pre op assessment.

## 2021-08-03 NOTE — Telephone Encounter (Signed)
   Chart reviewed in preparation for virtual visit today. Patient is a 81 y/o M with history of VF arrest in 2015 with extensive CAD noted at that time treated medically, ICD insertion, HTN, HLD, paroxysmal atrial fibrillation, HTN, HLD, DM. Historically in prior encounters he has been unable to complete over 4 METS so virtual visit may not be appropriate to ensure he is safe to proceed without further testing. He has also not had any labwork in our system for several years. Given >6 months since last OV, comorbidities, age, and chronic orthopedic issues limiting ability to clear by phone protocol, recommend in person OV instead with EKG. Can be with gen cards APP or EP APP. Thanks.  Jaiyah Beining PA-C

## 2021-08-09 DIAGNOSIS — K2100 Gastro-esophageal reflux disease with esophagitis, without bleeding: Secondary | ICD-10-CM | POA: Diagnosis not present

## 2021-08-09 DIAGNOSIS — E782 Mixed hyperlipidemia: Secondary | ICD-10-CM | POA: Diagnosis not present

## 2021-08-09 DIAGNOSIS — E7849 Other hyperlipidemia: Secondary | ICD-10-CM | POA: Diagnosis not present

## 2021-08-09 DIAGNOSIS — Z1329 Encounter for screening for other suspected endocrine disorder: Secondary | ICD-10-CM | POA: Diagnosis not present

## 2021-08-09 DIAGNOSIS — I10 Essential (primary) hypertension: Secondary | ICD-10-CM | POA: Diagnosis not present

## 2021-08-09 DIAGNOSIS — R7309 Other abnormal glucose: Secondary | ICD-10-CM | POA: Diagnosis not present

## 2021-08-10 DIAGNOSIS — R4182 Altered mental status, unspecified: Secondary | ICD-10-CM | POA: Diagnosis not present

## 2021-08-10 DIAGNOSIS — R410 Disorientation, unspecified: Secondary | ICD-10-CM | POA: Diagnosis not present

## 2021-08-10 DIAGNOSIS — E119 Type 2 diabetes mellitus without complications: Secondary | ICD-10-CM | POA: Diagnosis not present

## 2021-08-10 DIAGNOSIS — I10 Essential (primary) hypertension: Secondary | ICD-10-CM | POA: Diagnosis not present

## 2021-08-10 DIAGNOSIS — Z794 Long term (current) use of insulin: Secondary | ICD-10-CM | POA: Diagnosis not present

## 2021-08-10 DIAGNOSIS — J9811 Atelectasis: Secondary | ICD-10-CM | POA: Diagnosis not present

## 2021-08-10 DIAGNOSIS — R42 Dizziness and giddiness: Secondary | ICD-10-CM | POA: Diagnosis not present

## 2021-08-10 DIAGNOSIS — Z7984 Long term (current) use of oral hypoglycemic drugs: Secondary | ICD-10-CM | POA: Diagnosis not present

## 2021-08-10 DIAGNOSIS — Z888 Allergy status to other drugs, medicaments and biological substances status: Secondary | ICD-10-CM | POA: Diagnosis not present

## 2021-08-10 DIAGNOSIS — Z87891 Personal history of nicotine dependence: Secondary | ICD-10-CM | POA: Diagnosis not present

## 2021-08-14 DIAGNOSIS — J90 Pleural effusion, not elsewhere classified: Secondary | ICD-10-CM | POA: Diagnosis not present

## 2021-08-14 DIAGNOSIS — R410 Disorientation, unspecified: Secondary | ICD-10-CM | POA: Diagnosis not present

## 2021-08-14 DIAGNOSIS — Z794 Long term (current) use of insulin: Secondary | ICD-10-CM | POA: Diagnosis not present

## 2021-08-14 DIAGNOSIS — I080 Rheumatic disorders of both mitral and aortic valves: Secondary | ICD-10-CM | POA: Diagnosis not present

## 2021-08-14 DIAGNOSIS — R4701 Aphasia: Secondary | ICD-10-CM | POA: Diagnosis not present

## 2021-08-14 DIAGNOSIS — I517 Cardiomegaly: Secondary | ICD-10-CM | POA: Diagnosis not present

## 2021-08-14 DIAGNOSIS — M25531 Pain in right wrist: Secondary | ICD-10-CM | POA: Diagnosis not present

## 2021-08-14 DIAGNOSIS — E669 Obesity, unspecified: Secondary | ICD-10-CM | POA: Diagnosis not present

## 2021-08-14 DIAGNOSIS — I480 Paroxysmal atrial fibrillation: Secondary | ICD-10-CM | POA: Diagnosis not present

## 2021-08-14 DIAGNOSIS — I10 Essential (primary) hypertension: Secondary | ICD-10-CM | POA: Diagnosis not present

## 2021-08-14 DIAGNOSIS — E1165 Type 2 diabetes mellitus with hyperglycemia: Secondary | ICD-10-CM | POA: Diagnosis not present

## 2021-08-14 DIAGNOSIS — I491 Atrial premature depolarization: Secondary | ICD-10-CM | POA: Diagnosis not present

## 2021-08-14 DIAGNOSIS — I6322 Cerebral infarction due to unspecified occlusion or stenosis of basilar arteries: Secondary | ICD-10-CM | POA: Diagnosis not present

## 2021-08-14 DIAGNOSIS — I609 Nontraumatic subarachnoid hemorrhage, unspecified: Secondary | ICD-10-CM | POA: Diagnosis not present

## 2021-08-14 DIAGNOSIS — G319 Degenerative disease of nervous system, unspecified: Secondary | ICD-10-CM | POA: Diagnosis not present

## 2021-08-14 DIAGNOSIS — Z950 Presence of cardiac pacemaker: Secondary | ICD-10-CM | POA: Diagnosis not present

## 2021-08-14 DIAGNOSIS — R471 Dysarthria and anarthria: Secondary | ICD-10-CM | POA: Diagnosis not present

## 2021-08-14 DIAGNOSIS — D649 Anemia, unspecified: Secondary | ICD-10-CM | POA: Diagnosis not present

## 2021-08-14 DIAGNOSIS — R9082 White matter disease, unspecified: Secondary | ICD-10-CM | POA: Diagnosis not present

## 2021-08-14 DIAGNOSIS — G459 Transient cerebral ischemic attack, unspecified: Secondary | ICD-10-CM | POA: Diagnosis not present

## 2021-08-14 DIAGNOSIS — R29719 NIHSS score 19: Secondary | ICD-10-CM | POA: Diagnosis not present

## 2021-08-14 DIAGNOSIS — I6389 Other cerebral infarction: Secondary | ICD-10-CM | POA: Diagnosis not present

## 2021-08-14 DIAGNOSIS — M1811 Unilateral primary osteoarthritis of first carpometacarpal joint, right hand: Secondary | ICD-10-CM | POA: Diagnosis not present

## 2021-08-14 DIAGNOSIS — I6522 Occlusion and stenosis of left carotid artery: Secondary | ICD-10-CM | POA: Diagnosis not present

## 2021-08-14 DIAGNOSIS — I6523 Occlusion and stenosis of bilateral carotid arteries: Secondary | ICD-10-CM | POA: Diagnosis not present

## 2021-08-14 DIAGNOSIS — R7989 Other specified abnormal findings of blood chemistry: Secondary | ICD-10-CM | POA: Diagnosis not present

## 2021-08-14 DIAGNOSIS — Z6832 Body mass index (BMI) 32.0-32.9, adult: Secondary | ICD-10-CM | POA: Diagnosis not present

## 2021-08-14 DIAGNOSIS — I6782 Cerebral ischemia: Secondary | ICD-10-CM | POA: Diagnosis not present

## 2021-08-14 DIAGNOSIS — I2510 Atherosclerotic heart disease of native coronary artery without angina pectoris: Secondary | ICD-10-CM | POA: Diagnosis not present

## 2021-08-14 DIAGNOSIS — R29713 NIHSS score 13: Secondary | ICD-10-CM | POA: Diagnosis not present

## 2021-08-14 DIAGNOSIS — R29712 NIHSS score 12: Secondary | ICD-10-CM | POA: Diagnosis not present

## 2021-08-14 DIAGNOSIS — I4891 Unspecified atrial fibrillation: Secondary | ICD-10-CM | POA: Diagnosis not present

## 2021-08-14 DIAGNOSIS — I63512 Cerebral infarction due to unspecified occlusion or stenosis of left middle cerebral artery: Secondary | ICD-10-CM | POA: Diagnosis not present

## 2021-08-14 DIAGNOSIS — E7800 Pure hypercholesterolemia, unspecified: Secondary | ICD-10-CM | POA: Diagnosis not present

## 2021-08-14 DIAGNOSIS — Z95810 Presence of automatic (implantable) cardiac defibrillator: Secondary | ICD-10-CM | POA: Diagnosis not present

## 2021-08-14 DIAGNOSIS — R4182 Altered mental status, unspecified: Secondary | ICD-10-CM | POA: Diagnosis not present

## 2021-08-14 DIAGNOSIS — R9431 Abnormal electrocardiogram [ECG] [EKG]: Secondary | ICD-10-CM | POA: Diagnosis not present

## 2021-08-14 DIAGNOSIS — I272 Pulmonary hypertension, unspecified: Secondary | ICD-10-CM | POA: Diagnosis not present

## 2021-08-14 DIAGNOSIS — E871 Hypo-osmolality and hyponatremia: Secondary | ICD-10-CM | POA: Diagnosis not present

## 2021-08-14 DIAGNOSIS — M79641 Pain in right hand: Secondary | ICD-10-CM | POA: Diagnosis not present

## 2021-08-14 DIAGNOSIS — Z79899 Other long term (current) drug therapy: Secondary | ICD-10-CM | POA: Diagnosis not present

## 2021-08-14 DIAGNOSIS — R29711 NIHSS score 11: Secondary | ICD-10-CM | POA: Diagnosis not present

## 2021-08-14 DIAGNOSIS — I440 Atrioventricular block, first degree: Secondary | ICD-10-CM | POA: Diagnosis not present

## 2021-08-14 DIAGNOSIS — I672 Cerebral atherosclerosis: Secondary | ICD-10-CM | POA: Diagnosis not present

## 2021-08-14 DIAGNOSIS — M79601 Pain in right arm: Secondary | ICD-10-CM | POA: Diagnosis not present

## 2021-08-14 DIAGNOSIS — R531 Weakness: Secondary | ICD-10-CM | POA: Diagnosis not present

## 2021-08-14 DIAGNOSIS — Z7984 Long term (current) use of oral hypoglycemic drugs: Secondary | ICD-10-CM | POA: Diagnosis not present

## 2021-08-14 DIAGNOSIS — E782 Mixed hyperlipidemia: Secondary | ICD-10-CM | POA: Diagnosis not present

## 2021-08-14 DIAGNOSIS — Z87891 Personal history of nicotine dependence: Secondary | ICD-10-CM | POA: Diagnosis not present

## 2021-08-14 DIAGNOSIS — I639 Cerebral infarction, unspecified: Secondary | ICD-10-CM | POA: Diagnosis not present

## 2021-08-14 DIAGNOSIS — Z4682 Encounter for fitting and adjustment of non-vascular catheter: Secondary | ICD-10-CM | POA: Diagnosis not present

## 2021-08-14 DIAGNOSIS — R0989 Other specified symptoms and signs involving the circulatory and respiratory systems: Secondary | ICD-10-CM | POA: Diagnosis not present

## 2021-08-14 DIAGNOSIS — I152 Hypertension secondary to endocrine disorders: Secondary | ICD-10-CM | POA: Diagnosis not present

## 2021-08-14 DIAGNOSIS — E785 Hyperlipidemia, unspecified: Secondary | ICD-10-CM | POA: Diagnosis not present

## 2021-08-14 DIAGNOSIS — R404 Transient alteration of awareness: Secondary | ICD-10-CM | POA: Diagnosis not present

## 2021-08-14 DIAGNOSIS — E1169 Type 2 diabetes mellitus with other specified complication: Secondary | ICD-10-CM | POA: Diagnosis not present

## 2021-08-14 DIAGNOSIS — M109 Gout, unspecified: Secondary | ICD-10-CM | POA: Diagnosis not present

## 2021-08-14 DIAGNOSIS — R52 Pain, unspecified: Secondary | ICD-10-CM | POA: Diagnosis not present

## 2021-08-14 DIAGNOSIS — G9389 Other specified disorders of brain: Secondary | ICD-10-CM | POA: Diagnosis not present

## 2021-08-14 DIAGNOSIS — I252 Old myocardial infarction: Secondary | ICD-10-CM | POA: Diagnosis not present

## 2021-08-14 DIAGNOSIS — I63232 Cerebral infarction due to unspecified occlusion or stenosis of left carotid arteries: Secondary | ICD-10-CM | POA: Diagnosis not present

## 2021-08-14 DIAGNOSIS — R918 Other nonspecific abnormal finding of lung field: Secondary | ICD-10-CM | POA: Diagnosis not present

## 2021-08-14 DIAGNOSIS — Z7901 Long term (current) use of anticoagulants: Secondary | ICD-10-CM | POA: Diagnosis not present

## 2021-08-14 DIAGNOSIS — E119 Type 2 diabetes mellitus without complications: Secondary | ICD-10-CM | POA: Diagnosis not present

## 2021-08-14 DIAGNOSIS — Z981 Arthrodesis status: Secondary | ICD-10-CM | POA: Diagnosis not present

## 2021-08-14 DIAGNOSIS — G8191 Hemiplegia, unspecified affecting right dominant side: Secondary | ICD-10-CM | POA: Diagnosis not present

## 2021-08-14 DIAGNOSIS — M79631 Pain in right forearm: Secondary | ICD-10-CM | POA: Diagnosis not present

## 2021-08-14 DIAGNOSIS — Z95828 Presence of other vascular implants and grafts: Secondary | ICD-10-CM | POA: Diagnosis not present

## 2021-08-14 DIAGNOSIS — D72829 Elevated white blood cell count, unspecified: Secondary | ICD-10-CM | POA: Diagnosis not present

## 2021-08-14 DIAGNOSIS — Z7409 Other reduced mobility: Secondary | ICD-10-CM | POA: Diagnosis not present

## 2021-08-14 DIAGNOSIS — R29810 Facial weakness: Secondary | ICD-10-CM | POA: Diagnosis not present
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
## 2021-08-16 ENCOUNTER — Ambulatory Visit: Payer: Medicare Other | Admitting: Physician Assistant

## 2021-08-21 DIAGNOSIS — J9811 Atelectasis: Secondary | ICD-10-CM | POA: Diagnosis not present

## 2021-08-21 DIAGNOSIS — R1310 Dysphagia, unspecified: Secondary | ICD-10-CM | POA: Diagnosis not present

## 2021-08-21 DIAGNOSIS — G459 Transient cerebral ischemic attack, unspecified: Secondary | ICD-10-CM | POA: Diagnosis not present

## 2021-08-21 DIAGNOSIS — Z7409 Other reduced mobility: Secondary | ICD-10-CM | POA: Diagnosis not present

## 2021-08-21 DIAGNOSIS — I69351 Hemiplegia and hemiparesis following cerebral infarction affecting right dominant side: Secondary | ICD-10-CM | POA: Diagnosis not present

## 2021-08-21 DIAGNOSIS — R1311 Dysphagia, oral phase: Secondary | ICD-10-CM | POA: Diagnosis not present

## 2021-08-21 DIAGNOSIS — Z743 Need for continuous supervision: Secondary | ICD-10-CM | POA: Diagnosis not present

## 2021-08-21 DIAGNOSIS — I639 Cerebral infarction, unspecified: Secondary | ICD-10-CM | POA: Diagnosis not present

## 2021-08-21 DIAGNOSIS — I609 Nontraumatic subarachnoid hemorrhage, unspecified: Secondary | ICD-10-CM | POA: Diagnosis not present

## 2021-08-21 DIAGNOSIS — Z794 Long term (current) use of insulin: Secondary | ICD-10-CM | POA: Diagnosis not present

## 2021-08-21 DIAGNOSIS — M79631 Pain in right forearm: Secondary | ICD-10-CM | POA: Diagnosis not present

## 2021-08-21 DIAGNOSIS — I152 Hypertension secondary to endocrine disorders: Secondary | ICD-10-CM | POA: Diagnosis not present

## 2021-08-21 DIAGNOSIS — M25531 Pain in right wrist: Secondary | ICD-10-CM | POA: Diagnosis not present

## 2021-08-21 DIAGNOSIS — E1165 Type 2 diabetes mellitus with hyperglycemia: Secondary | ICD-10-CM | POA: Diagnosis not present

## 2021-08-21 DIAGNOSIS — I69318 Other symptoms and signs involving cognitive functions following cerebral infarction: Secondary | ICD-10-CM | POA: Diagnosis not present

## 2021-08-21 DIAGNOSIS — R269 Unspecified abnormalities of gait and mobility: Secondary | ICD-10-CM | POA: Diagnosis not present

## 2021-08-21 DIAGNOSIS — E785 Hyperlipidemia, unspecified: Secondary | ICD-10-CM | POA: Diagnosis not present

## 2021-08-21 DIAGNOSIS — I63232 Cerebral infarction due to unspecified occlusion or stenosis of left carotid arteries: Secondary | ICD-10-CM | POA: Diagnosis not present

## 2021-08-21 DIAGNOSIS — R29898 Other symptoms and signs involving the musculoskeletal system: Secondary | ICD-10-CM | POA: Diagnosis not present

## 2021-08-21 DIAGNOSIS — Z7901 Long term (current) use of anticoagulants: Secondary | ICD-10-CM | POA: Diagnosis not present

## 2021-08-21 DIAGNOSIS — I69398 Other sequelae of cerebral infarction: Secondary | ICD-10-CM | POA: Diagnosis not present

## 2021-08-21 DIAGNOSIS — I69320 Aphasia following cerebral infarction: Secondary | ICD-10-CM | POA: Diagnosis not present

## 2021-08-21 DIAGNOSIS — I480 Paroxysmal atrial fibrillation: Secondary | ICD-10-CM | POA: Diagnosis not present

## 2021-08-21 DIAGNOSIS — I10 Essential (primary) hypertension: Secondary | ICD-10-CM | POA: Diagnosis not present

## 2021-08-21 DIAGNOSIS — R2689 Other abnormalities of gait and mobility: Secondary | ICD-10-CM | POA: Diagnosis not present

## 2021-08-21 DIAGNOSIS — M109 Gout, unspecified: Secondary | ICD-10-CM | POA: Diagnosis not present

## 2021-08-22 DIAGNOSIS — Z7409 Other reduced mobility: Secondary | ICD-10-CM | POA: Diagnosis not present

## 2021-08-22 DIAGNOSIS — E1165 Type 2 diabetes mellitus with hyperglycemia: Secondary | ICD-10-CM | POA: Diagnosis not present

## 2021-08-22 DIAGNOSIS — I10 Essential (primary) hypertension: Secondary | ICD-10-CM | POA: Diagnosis not present

## 2021-08-22 DIAGNOSIS — I609 Nontraumatic subarachnoid hemorrhage, unspecified: Secondary | ICD-10-CM | POA: Diagnosis not present

## 2021-08-22 DIAGNOSIS — Z794 Long term (current) use of insulin: Secondary | ICD-10-CM | POA: Diagnosis not present

## 2021-08-22 DIAGNOSIS — R1311 Dysphagia, oral phase: Secondary | ICD-10-CM | POA: Diagnosis not present

## 2021-08-22 DIAGNOSIS — I480 Paroxysmal atrial fibrillation: Secondary | ICD-10-CM | POA: Diagnosis not present

## 2021-08-22 DIAGNOSIS — E785 Hyperlipidemia, unspecified: Secondary | ICD-10-CM | POA: Diagnosis not present

## 2021-08-22 DIAGNOSIS — I63232 Cerebral infarction due to unspecified occlusion or stenosis of left carotid arteries: Secondary | ICD-10-CM | POA: Diagnosis not present

## 2021-08-23 DIAGNOSIS — I63232 Cerebral infarction due to unspecified occlusion or stenosis of left carotid arteries: Secondary | ICD-10-CM | POA: Diagnosis not present

## 2021-08-23 DIAGNOSIS — I480 Paroxysmal atrial fibrillation: Secondary | ICD-10-CM | POA: Diagnosis not present

## 2021-08-23 DIAGNOSIS — Z7409 Other reduced mobility: Secondary | ICD-10-CM | POA: Diagnosis not present

## 2021-08-23 DIAGNOSIS — R1311 Dysphagia, oral phase: Secondary | ICD-10-CM | POA: Diagnosis not present

## 2021-08-23 DIAGNOSIS — I10 Essential (primary) hypertension: Secondary | ICD-10-CM | POA: Diagnosis not present

## 2021-08-24 DIAGNOSIS — I639 Cerebral infarction, unspecified: Secondary | ICD-10-CM | POA: Diagnosis not present

## 2021-08-24 DIAGNOSIS — E1165 Type 2 diabetes mellitus with hyperglycemia: Secondary | ICD-10-CM | POA: Diagnosis not present

## 2021-08-24 DIAGNOSIS — R269 Unspecified abnormalities of gait and mobility: Secondary | ICD-10-CM | POA: Diagnosis not present

## 2021-08-24 DIAGNOSIS — I480 Paroxysmal atrial fibrillation: Secondary | ICD-10-CM | POA: Diagnosis not present

## 2021-08-24 DIAGNOSIS — R1310 Dysphagia, unspecified: Secondary | ICD-10-CM | POA: Diagnosis not present

## 2021-08-25 DIAGNOSIS — R269 Unspecified abnormalities of gait and mobility: Secondary | ICD-10-CM | POA: Diagnosis not present

## 2021-08-25 DIAGNOSIS — E1165 Type 2 diabetes mellitus with hyperglycemia: Secondary | ICD-10-CM | POA: Diagnosis not present

## 2021-08-25 DIAGNOSIS — I639 Cerebral infarction, unspecified: Secondary | ICD-10-CM | POA: Diagnosis not present

## 2021-08-25 DIAGNOSIS — I480 Paroxysmal atrial fibrillation: Secondary | ICD-10-CM | POA: Diagnosis not present

## 2021-08-25 DIAGNOSIS — Z794 Long term (current) use of insulin: Secondary | ICD-10-CM | POA: Diagnosis not present

## 2021-08-25 DIAGNOSIS — R1310 Dysphagia, unspecified: Secondary | ICD-10-CM | POA: Diagnosis not present

## 2021-08-25 DIAGNOSIS — I10 Essential (primary) hypertension: Secondary | ICD-10-CM | POA: Diagnosis not present

## 2021-08-25 DIAGNOSIS — E785 Hyperlipidemia, unspecified: Secondary | ICD-10-CM | POA: Diagnosis not present

## 2021-08-30 ENCOUNTER — Inpatient Hospital Stay (HOSPITAL_COMMUNITY): Admission: RE | Admit: 2021-08-30 | Payer: Medicare Other | Source: Ambulatory Visit

## 2021-09-11 ENCOUNTER — Ambulatory Visit: Admit: 2021-09-11 | Payer: Medicare Other | Admitting: Orthopedic Surgery

## 2021-09-11 DIAGNOSIS — M1712 Unilateral primary osteoarthritis, left knee: Secondary | ICD-10-CM

## 2021-09-11 SURGERY — ARTHROPLASTY, KNEE, TOTAL
Anesthesia: Spinal | Site: Knee | Laterality: Left

## 2021-09-25 ENCOUNTER — Other Ambulatory Visit: Payer: Self-pay | Admitting: Internal Medicine

## 2021-09-28 ENCOUNTER — Ambulatory Visit (INDEPENDENT_AMBULATORY_CARE_PROVIDER_SITE_OTHER): Payer: Medicare Other

## 2021-09-28 DIAGNOSIS — I4901 Ventricular fibrillation: Secondary | ICD-10-CM | POA: Diagnosis not present

## 2021-10-01 DIAGNOSIS — I25119 Atherosclerotic heart disease of native coronary artery with unspecified angina pectoris: Secondary | ICD-10-CM | POA: Diagnosis not present

## 2021-10-01 DIAGNOSIS — G8191 Hemiplegia, unspecified affecting right dominant side: Secondary | ICD-10-CM | POA: Diagnosis not present

## 2021-10-01 DIAGNOSIS — D692 Other nonthrombocytopenic purpura: Secondary | ICD-10-CM | POA: Diagnosis not present

## 2021-10-01 LAB — CUP PACEART REMOTE DEVICE CHECK
Battery Remaining Longevity: 23 mo
Battery Voltage: 2.92 V
Brady Statistic AP VP Percent: 0.01 %
Brady Statistic AP VS Percent: 1.14 %
Brady Statistic AS VP Percent: 0.04 %
Brady Statistic AS VS Percent: 98.82 %
Brady Statistic RA Percent Paced: 1.14 %
Brady Statistic RV Percent Paced: 0.05 %
Date Time Interrogation Session: 20230714144956
HighPow Impedance: 74 Ohm
Implantable Lead Implant Date: 20151202
Implantable Lead Implant Date: 20151202
Implantable Lead Location: 753859
Implantable Lead Location: 753860
Implantable Lead Model: 5076
Implantable Lead Model: 6935
Implantable Pulse Generator Implant Date: 20151202
Lead Channel Impedance Value: 342 Ohm
Lead Channel Impedance Value: 380 Ohm
Lead Channel Impedance Value: 399 Ohm
Lead Channel Pacing Threshold Amplitude: 0.625 V
Lead Channel Pacing Threshold Amplitude: 0.75 V
Lead Channel Pacing Threshold Pulse Width: 0.4 ms
Lead Channel Pacing Threshold Pulse Width: 0.4 ms
Lead Channel Sensing Intrinsic Amplitude: 2.375 mV
Lead Channel Sensing Intrinsic Amplitude: 2.375 mV
Lead Channel Sensing Intrinsic Amplitude: 7.625 mV
Lead Channel Sensing Intrinsic Amplitude: 7.625 mV
Lead Channel Setting Pacing Amplitude: 1.5 V
Lead Channel Setting Pacing Amplitude: 2 V
Lead Channel Setting Pacing Pulse Width: 0.4 ms
Lead Channel Setting Sensing Sensitivity: 0.3 mV

## 2021-10-03 DIAGNOSIS — E1169 Type 2 diabetes mellitus with other specified complication: Secondary | ICD-10-CM | POA: Diagnosis not present

## 2021-10-03 DIAGNOSIS — I10 Essential (primary) hypertension: Secondary | ICD-10-CM | POA: Diagnosis not present

## 2021-10-03 DIAGNOSIS — I480 Paroxysmal atrial fibrillation: Secondary | ICD-10-CM | POA: Diagnosis not present

## 2021-10-03 DIAGNOSIS — Z8673 Personal history of transient ischemic attack (TIA), and cerebral infarction without residual deficits: Secondary | ICD-10-CM | POA: Diagnosis not present

## 2021-10-03 DIAGNOSIS — E782 Mixed hyperlipidemia: Secondary | ICD-10-CM | POA: Diagnosis not present

## 2021-10-11 DIAGNOSIS — Z20828 Contact with and (suspected) exposure to other viral communicable diseases: Secondary | ICD-10-CM | POA: Diagnosis not present

## 2021-10-11 NOTE — Progress Notes (Signed)
Remote ICD transmission.   

## 2021-10-25 DIAGNOSIS — I639 Cerebral infarction, unspecified: Secondary | ICD-10-CM | POA: Diagnosis not present

## 2021-10-30 ENCOUNTER — Other Ambulatory Visit: Payer: Self-pay | Admitting: Internal Medicine

## 2021-11-06 DIAGNOSIS — Z6830 Body mass index (BMI) 30.0-30.9, adult: Secondary | ICD-10-CM | POA: Diagnosis not present

## 2021-11-06 DIAGNOSIS — L98429 Non-pressure chronic ulcer of back with unspecified severity: Secondary | ICD-10-CM | POA: Diagnosis not present

## 2021-11-12 DIAGNOSIS — M79601 Pain in right arm: Secondary | ICD-10-CM | POA: Diagnosis not present

## 2021-11-12 DIAGNOSIS — R278 Other lack of coordination: Secondary | ICD-10-CM | POA: Diagnosis not present

## 2021-11-12 DIAGNOSIS — M25521 Pain in right elbow: Secondary | ICD-10-CM | POA: Diagnosis not present

## 2021-11-12 DIAGNOSIS — R531 Weakness: Secondary | ICD-10-CM | POA: Diagnosis not present

## 2021-11-22 ENCOUNTER — Other Ambulatory Visit: Payer: Self-pay | Admitting: Internal Medicine

## 2021-12-03 ENCOUNTER — Other Ambulatory Visit: Payer: Self-pay | Admitting: Internal Medicine

## 2021-12-03 DIAGNOSIS — I480 Paroxysmal atrial fibrillation: Secondary | ICD-10-CM

## 2021-12-03 NOTE — Telephone Encounter (Signed)
Prescription refill request for Eliquis received. Indication: PAF Last office visit: 01/09/21  Beckie Salts MD Scr: 0.91 on 08/21/21 Age: 81 Weight: 107kg  Based on above findings Eliquis '5mg'$  twice daily is the appropriate dose.  Refill approved.

## 2021-12-19 DIAGNOSIS — L218 Other seborrheic dermatitis: Secondary | ICD-10-CM | POA: Diagnosis not present

## 2021-12-25 ENCOUNTER — Other Ambulatory Visit: Payer: Self-pay | Admitting: Internal Medicine

## 2021-12-25 DIAGNOSIS — E1142 Type 2 diabetes mellitus with diabetic polyneuropathy: Secondary | ICD-10-CM | POA: Diagnosis not present

## 2021-12-25 DIAGNOSIS — E039 Hypothyroidism, unspecified: Secondary | ICD-10-CM | POA: Diagnosis not present

## 2021-12-25 DIAGNOSIS — K219 Gastro-esophageal reflux disease without esophagitis: Secondary | ICD-10-CM | POA: Diagnosis not present

## 2021-12-25 DIAGNOSIS — E7849 Other hyperlipidemia: Secondary | ICD-10-CM | POA: Diagnosis not present

## 2021-12-25 DIAGNOSIS — I10 Essential (primary) hypertension: Secondary | ICD-10-CM | POA: Diagnosis not present

## 2021-12-28 ENCOUNTER — Ambulatory Visit (INDEPENDENT_AMBULATORY_CARE_PROVIDER_SITE_OTHER): Payer: Medicare Other

## 2021-12-28 DIAGNOSIS — I469 Cardiac arrest, cause unspecified: Secondary | ICD-10-CM

## 2021-12-30 LAB — CUP PACEART REMOTE DEVICE CHECK
Battery Remaining Longevity: 22 mo
Battery Voltage: 2.91 V
Brady Statistic AP VP Percent: 0.02 %
Brady Statistic AP VS Percent: 15.14 %
Brady Statistic AS VP Percent: 0.06 %
Brady Statistic AS VS Percent: 84.78 %
Brady Statistic RA Percent Paced: 15 %
Brady Statistic RV Percent Paced: 0.08 %
Date Time Interrogation Session: 20231013202615
HighPow Impedance: 76 Ohm
Implantable Lead Implant Date: 20151202
Implantable Lead Implant Date: 20151202
Implantable Lead Location: 753859
Implantable Lead Location: 753860
Implantable Lead Model: 5076
Implantable Lead Model: 6935
Implantable Pulse Generator Implant Date: 20151202
Lead Channel Impedance Value: 342 Ohm
Lead Channel Impedance Value: 399 Ohm
Lead Channel Impedance Value: 456 Ohm
Lead Channel Pacing Threshold Amplitude: 0.625 V
Lead Channel Pacing Threshold Amplitude: 0.75 V
Lead Channel Pacing Threshold Pulse Width: 0.4 ms
Lead Channel Pacing Threshold Pulse Width: 0.4 ms
Lead Channel Sensing Intrinsic Amplitude: 2.125 mV
Lead Channel Sensing Intrinsic Amplitude: 2.125 mV
Lead Channel Sensing Intrinsic Amplitude: 7.125 mV
Lead Channel Sensing Intrinsic Amplitude: 7.125 mV
Lead Channel Setting Pacing Amplitude: 1.5 V
Lead Channel Setting Pacing Amplitude: 2 V
Lead Channel Setting Pacing Pulse Width: 0.4 ms
Lead Channel Setting Sensing Sensitivity: 0.3 mV

## 2022-01-02 NOTE — Progress Notes (Signed)
Remote ICD transmission.   

## 2022-01-03 DIAGNOSIS — D6869 Other thrombophilia: Secondary | ICD-10-CM | POA: Diagnosis not present

## 2022-01-03 DIAGNOSIS — E1342 Other specified diabetes mellitus with diabetic polyneuropathy: Secondary | ICD-10-CM | POA: Diagnosis not present

## 2022-01-03 DIAGNOSIS — Z23 Encounter for immunization: Secondary | ICD-10-CM | POA: Diagnosis not present

## 2022-01-03 DIAGNOSIS — I25119 Atherosclerotic heart disease of native coronary artery with unspecified angina pectoris: Secondary | ICD-10-CM | POA: Diagnosis not present

## 2022-01-03 DIAGNOSIS — Z0001 Encounter for general adult medical examination with abnormal findings: Secondary | ICD-10-CM | POA: Diagnosis not present

## 2022-01-03 DIAGNOSIS — K2100 Gastro-esophageal reflux disease with esophagitis, without bleeding: Secondary | ICD-10-CM | POA: Diagnosis not present

## 2022-01-03 DIAGNOSIS — E7849 Other hyperlipidemia: Secondary | ICD-10-CM | POA: Diagnosis not present

## 2022-01-03 DIAGNOSIS — M5030 Other cervical disc degeneration, unspecified cervical region: Secondary | ICD-10-CM | POA: Diagnosis not present

## 2022-01-03 DIAGNOSIS — I482 Chronic atrial fibrillation, unspecified: Secondary | ICD-10-CM | POA: Diagnosis not present

## 2022-01-03 DIAGNOSIS — M1711 Unilateral primary osteoarthritis, right knee: Secondary | ICD-10-CM | POA: Diagnosis not present

## 2022-01-03 DIAGNOSIS — I10 Essential (primary) hypertension: Secondary | ICD-10-CM | POA: Diagnosis not present

## 2022-01-03 DIAGNOSIS — E1142 Type 2 diabetes mellitus with diabetic polyneuropathy: Secondary | ICD-10-CM | POA: Diagnosis not present

## 2022-01-17 ENCOUNTER — Ambulatory Visit: Payer: Medicare Other | Attending: Internal Medicine | Admitting: Internal Medicine

## 2022-01-17 ENCOUNTER — Encounter: Payer: Self-pay | Admitting: Internal Medicine

## 2022-01-17 VITALS — BP 142/76 | HR 61 | Ht 72.0 in | Wt 209.0 lb

## 2022-01-17 DIAGNOSIS — I4901 Ventricular fibrillation: Secondary | ICD-10-CM | POA: Diagnosis not present

## 2022-01-17 DIAGNOSIS — I480 Paroxysmal atrial fibrillation: Secondary | ICD-10-CM | POA: Insufficient documentation

## 2022-01-17 NOTE — Progress Notes (Signed)
HPI  [name] returns today for followup of his ICD. He is a pleasant 81 yo man with a h/o VF arrest, CAD, s/p MI, atrial fib and gout. He is s/p medtronic ICD insertion. In the interim, he notes that he has done well. He does admit to some dietary indiscretion with sodium. No chest pain or sob or edema or syncope. He has had some difficulty staying asleep. He has not had any ICD therapies. In May he had a stroke despite being on eliquis and despite having only minutes of atrial fib a day.    Current Outpatient Medications  Medication Sig Dispense Refill   acetaminophen (TYLENOL) 650 MG CR tablet Take 650-1,300 mg by mouth every 8 (eight) hours as needed for pain.     allopurinol (ZYLOPRIM) 300 MG tablet Take 300 mg by mouth daily.     apixaban (ELIQUIS) 5 MG TABS tablet TAKE 1 TABLET BY MOUTH TWICE A DAY 180 tablet 1   aspirin EC 81 MG tablet Take 81 mg by mouth daily. Swallow whole.     atorvastatin (LIPITOR) 40 MG tablet Take 40 mg by mouth at bedtime.     COLCRYS 0.6 MG tablet Take 0.6 mg by mouth 2 (two) times daily as needed (gout flare ups).   3   insulin NPH-regular Human (NOVOLIN 70/30) (70-30) 100 UNIT/ML injection Inject 50 Units into the skin at bedtime.     ketotifen (ZADITOR) 0.025 % ophthalmic solution Place 1 drop into both eyes 2 (two) times daily as needed (allergy eyes (during Spring & Fall ONLY)).     lisinopril (ZESTRIL) 5 MG tablet Take 5 mg by mouth daily.     metFORMIN (GLUCOPHAGE) 1000 MG tablet Take 1,000 mg by mouth 2 (two) times daily with a meal.     metoprolol tartrate (LOPRESSOR) 25 MG tablet TAKE 1 TABLET 2 (TWO) TIMES DAILY. PLEASE KEEP OCTOBER APPOINTMENT FOR FUTURE REFILLS. THANK YOU. 180 tablet 0   diphenhydrAMINE (BENADRYL) 25 MG tablet Take 25 mg by mouth every 6 (six) hours as needed (itching/allergies.). (Patient not taking: Reported on 01/17/2022)     HYDROCORTISONE EX Apply 1 application topically 2 (two) times daily as needed (skin  irritation/rash). (Patient not taking: Reported on 01/17/2022)     lisinopril (PRINIVIL,ZESTRIL) 40 MG tablet Take 40 mg by mouth every evening.      traMADol (ULTRAM) 50 MG tablet Take 50 mg by mouth 3 (three) times daily as needed (back pain.).      No current facility-administered medications for this visit.     Past Medical History:  Diagnosis Date   CAD (coronary artery disease)    a. RCA PCI in 2003 b. 2008-RCA was occluded; residual CAD- 70% CFX and 50% LAD with normal LVF c. low risk Myoview in Dec 2011   Diabetes mellitus ([place])    Dyslipidemia    GERD (gastroesophageal reflux disease)    HTN (hypertension)    Ischemic cardiomyopathy    a. s/p MDT dual chamber ICD 02-2014   Osteoarthritis     ROS:   All systems reviewed and negative except as noted in the HPI.   Past Surgical History:  Procedure Laterality Date   CARDIAC CATHETERIZATION  2008   occluded RCA- med Rx   CATARACT EXTRACTION Right July 2011   CATARACT EXTRACTION W/PHACO Left 03/25/2019   Procedure: CATARACT EXTRACTION PHACO AND INTRAOCULAR LENS PLACEMENT ([facility]);  Surgeon: Baruch Goldmann, MD;  Location: [facility];  Service: Ophthalmology;  Laterality: Left;  CDE: 11.80    CERVICAL SPINE SURGERY  Feb 2010   CORONARY ANGIOPLASTY  2003   RCA PCI   IMPLANTABLE CARDIOVERTER DEFIBRILLATOR IMPLANT  02-16-2014   MDT Evera dual chamber ICD implanted by Dr Lovena Le   IMPLANTABLE CARDIOVERTER DEFIBRILLATOR IMPLANT N/A 02/16/2014   Procedure: IMPLANTABLE CARDIOVERTER DEFIBRILLATOR IMPLANT;  Surgeon: Evans Lance, MD;  Location: [facility] CATH LAB;  Service: Cardiovascular;  Laterality: N/A;   LEFT HEART CATHETERIZATION WITH CORONARY ANGIOGRAM N/A 01/05/2014   Procedure: LEFT HEART CATHETERIZATION WITH CORONARY ANGIOGRAM;  Surgeon: Wellington Hampshire, MD;  Location: [facility] CATH LAB;  Service: Cardiovascular;  Laterality: N/A;     Family History  Problem Relation Age of Onset   Diabetes Mother    Cancer - Prostate Father    Diabetes  Other      Social History   Socioeconomic History   Marital status: Married    Spouse name: Not on file   Number of children: Not on file   Years of education: Not on file   Highest education level: Not on file  Occupational History   Not on file  Tobacco Use   Smoking status: Former    Types: Cigarettes    Quit date: 03/25/1983    Years since quitting: 38.8   Smokeless tobacco: Former    Quit date: 01/04/1983  Vaping Use   Vaping Use: Never used  Substance and Sexual Activity   Alcohol use: Yes    Comment: ONCE IN A WHILE    Drug use: No   Sexual activity: Yes  Other Topics Concern   Not on file  Social History Narrative   Not on file   Social Determinants of Health   Financial Resource Strain: Not on file  Food Insecurity: Not on file  Transportation Needs: Not on file  Physical Activity: Not on file  Stress: Not on file  Social Connections: Not on file  Intimate Partner Violence: Not on file     BP (!) 142/76   Ht 6' (1.829 m)   Wt 209 lb (94.8 kg)   BMI 28.35 kg/m   Physical Exam:  Well appearing NAD HEENT: Unremarkable Neck:  No JVD, no thyromegally Lymphatics:  No adenopathy Back:  No CVA tenderness Lungs:  Clear HEART:  Regular rate rhythm, no murmurs, no rubs, no clicks Abd:  soft, positive bowel sounds, no organomegally, no rebound, no guarding Ext:  2 plus pulses, no edema, no cyanosis, no clubbing Skin:  No rashes no nodules Neuro:  CN II through XII intact, motor grossly intact  EKG - nsr  DEVICE  Normal device function.  See PaceArt for details.   Assess/Plan: 1. VF - he has had no recurrent ventricular arrhythmias. 2. PAF - he has had a few seconds of atrial fib. He will continue his systemic anti-coagulation. 3. ICD - his medtronic DDD ICD is working normally and has several years of battery longevity left 4. CAD - he is s/p MI. No anginal symptoms.     Trevor Overlie Melady Chow,MD

## 2022-01-17 NOTE — Addendum Note (Signed)
Addended by: Levonne Hubert on: 01/17/2022 04:44 PM   Modules accepted: Orders

## 2022-01-17 NOTE — Patient Instructions (Signed)
Medication Instructions:  Your physician recommends that you continue on your current medications as directed. Please refer to the Current Medication list given to you today.  *If you need a refill on your cardiac medications before your next appointment, please call your pharmacy*   Lab Work: NONE   If you have labs (blood work) drawn today and your tests are completely normal, you will receive your results only by: MyChart Message (if you have MyChart) OR A paper copy in the mail If you have any lab test that is abnormal or we need to change your treatment, we will call you to review the results.   Testing/Procedures: NONE    Follow-Up: At [facility], you and your health needs are our priority.  As part of our continuing mission to provide you with exceptional heart care, we have created designated Provider Care Teams.  These Care Teams include your primary Cardiologist (physician) and Advanced Practice Providers (APPs -  Physician Assistants and Nurse Practitioners) who all work together to provide you with the care you need, when you need it.  We recommend signing up for the patient portal called "MyChart".  Sign up information is provided on this After Visit Summary.  MyChart is used to connect with patients for Virtual Visits (Telemedicine).  Patients are able to view lab/test results, encounter notes, upcoming appointments, etc.  Non-urgent messages can be sent to your provider as well.   To learn more about what you can do with MyChart, go to [email or web address].    Your next appointment:   1 year(s)  The format for your next appointment:   In Person  Provider:   Gregg Taylor, MD    Other Instructions Thank you for choosing [facility]!    Important Information About Sugar       

## 2022-02-21 DIAGNOSIS — I639 Cerebral infarction, unspecified: Secondary | ICD-10-CM | POA: Diagnosis not present

## 2022-03-01 DIAGNOSIS — Z95828 Presence of other vascular implants and grafts: Secondary | ICD-10-CM | POA: Diagnosis not present

## 2022-03-01 DIAGNOSIS — I639 Cerebral infarction, unspecified: Secondary | ICD-10-CM | POA: Diagnosis not present

## 2022-03-19 ENCOUNTER — Other Ambulatory Visit: Payer: Self-pay | Admitting: Internal Medicine

## 2022-03-29 ENCOUNTER — Ambulatory Visit (INDEPENDENT_AMBULATORY_CARE_PROVIDER_SITE_OTHER): Payer: Medicare Other

## 2022-03-29 DIAGNOSIS — I4901 Ventricular fibrillation: Secondary | ICD-10-CM | POA: Diagnosis not present

## 2022-04-01 LAB — CUP PACEART REMOTE DEVICE CHECK
Battery Remaining Longevity: 20 mo
Battery Voltage: 2.9 V
Brady Statistic AP VP Percent: 0.02 %
Brady Statistic AP VS Percent: 17.57 %
Brady Statistic AS VP Percent: 0.06 %
Brady Statistic AS VS Percent: 82.34 %
Brady Statistic RA Percent Paced: 17.4 %
Brady Statistic RV Percent Paced: 0.08 %
Date Time Interrogation Session: 20240113093328
HighPow Impedance: 71 Ohm
Implantable Lead Connection Status: 753985
Implantable Lead Connection Status: 753985
Implantable Lead Implant Date: 20151202
Implantable Lead Implant Date: 20151202
Implantable Lead Location: 753859
Implantable Lead Location: 753860
Implantable Lead Model: 5076
Implantable Lead Model: 6935
Implantable Pulse Generator Implant Date: 20151202
Lead Channel Impedance Value: 342 Ohm
Lead Channel Impedance Value: 380 Ohm
Lead Channel Impedance Value: 399 Ohm
Lead Channel Pacing Threshold Amplitude: 0.5 V
Lead Channel Pacing Threshold Amplitude: 0.875 V
Lead Channel Pacing Threshold Pulse Width: 0.4 ms
Lead Channel Pacing Threshold Pulse Width: 0.4 ms
Lead Channel Sensing Intrinsic Amplitude: 2.625 mV
Lead Channel Sensing Intrinsic Amplitude: 2.625 mV
Lead Channel Sensing Intrinsic Amplitude: 7.5 mV
Lead Channel Sensing Intrinsic Amplitude: 7.5 mV
Lead Channel Setting Pacing Amplitude: 1.5 V
Lead Channel Setting Pacing Amplitude: 2 V
Lead Channel Setting Pacing Pulse Width: 0.4 ms
Lead Channel Setting Sensing Sensitivity: 0.3 mV
Zone Setting Status: 755011

## 2022-04-16 NOTE — Progress Notes (Signed)
Remote ICD transmission.   

## 2022-04-30 DIAGNOSIS — E039 Hypothyroidism, unspecified: Secondary | ICD-10-CM | POA: Diagnosis not present

## 2022-04-30 DIAGNOSIS — E1142 Type 2 diabetes mellitus with diabetic polyneuropathy: Secondary | ICD-10-CM | POA: Diagnosis not present

## 2022-04-30 DIAGNOSIS — K2100 Gastro-esophageal reflux disease with esophagitis, without bleeding: Secondary | ICD-10-CM | POA: Diagnosis not present

## 2022-04-30 DIAGNOSIS — E7849 Other hyperlipidemia: Secondary | ICD-10-CM | POA: Diagnosis not present

## 2022-04-30 DIAGNOSIS — M109 Gout, unspecified: Secondary | ICD-10-CM | POA: Diagnosis not present

## 2022-05-08 DIAGNOSIS — I482 Chronic atrial fibrillation, unspecified: Secondary | ICD-10-CM | POA: Diagnosis not present

## 2022-05-08 DIAGNOSIS — E1142 Type 2 diabetes mellitus with diabetic polyneuropathy: Secondary | ICD-10-CM | POA: Diagnosis not present

## 2022-05-08 DIAGNOSIS — E7849 Other hyperlipidemia: Secondary | ICD-10-CM | POA: Diagnosis not present

## 2022-05-08 DIAGNOSIS — E1342 Other specified diabetes mellitus with diabetic polyneuropathy: Secondary | ICD-10-CM | POA: Diagnosis not present

## 2022-05-08 DIAGNOSIS — I10 Essential (primary) hypertension: Secondary | ICD-10-CM | POA: Diagnosis not present

## 2022-05-08 DIAGNOSIS — D6869 Other thrombophilia: Secondary | ICD-10-CM | POA: Diagnosis not present

## 2022-05-08 DIAGNOSIS — M5030 Other cervical disc degeneration, unspecified cervical region: Secondary | ICD-10-CM | POA: Diagnosis not present

## 2022-05-08 DIAGNOSIS — M1711 Unilateral primary osteoarthritis, right knee: Secondary | ICD-10-CM | POA: Diagnosis not present

## 2022-05-08 DIAGNOSIS — Z23 Encounter for immunization: Secondary | ICD-10-CM | POA: Diagnosis not present

## 2022-05-08 DIAGNOSIS — D692 Other nonthrombocytopenic purpura: Secondary | ICD-10-CM | POA: Diagnosis not present

## 2022-05-08 DIAGNOSIS — I25119 Atherosclerotic heart disease of native coronary artery with unspecified angina pectoris: Secondary | ICD-10-CM | POA: Diagnosis not present

## 2022-05-08 DIAGNOSIS — K2100 Gastro-esophageal reflux disease with esophagitis, without bleeding: Secondary | ICD-10-CM | POA: Diagnosis not present

## 2022-06-17 ENCOUNTER — Other Ambulatory Visit: Payer: Self-pay | Admitting: Internal Medicine

## 2022-06-17 DIAGNOSIS — I480 Paroxysmal atrial fibrillation: Secondary | ICD-10-CM

## 2022-06-18 DIAGNOSIS — L719 Rosacea, unspecified: Secondary | ICD-10-CM | POA: Diagnosis not present

## 2022-06-18 DIAGNOSIS — L218 Other seborrheic dermatitis: Secondary | ICD-10-CM | POA: Diagnosis not present

## 2022-06-18 DIAGNOSIS — D0421 Carcinoma in situ of skin of right ear and external auricular canal: Secondary | ICD-10-CM | POA: Diagnosis not present

## 2022-06-18 DIAGNOSIS — L280 Lichen simplex chronicus: Secondary | ICD-10-CM | POA: Diagnosis not present

## 2022-06-18 DIAGNOSIS — D485 Neoplasm of uncertain behavior of skin: Secondary | ICD-10-CM | POA: Diagnosis not present

## 2022-06-18 NOTE — Telephone Encounter (Signed)
Pt last saw Dr Lovena Le 01/17/22, labs 08/21/21 Creat 0.91, weight 94.8kg, age 82, based on specified criteria pt is on appropriate dosage of Eliquis 5mg  BID for afib.  Will refill rx.

## 2022-06-27 DIAGNOSIS — C44222 Squamous cell carcinoma of skin of right ear and external auricular canal: Secondary | ICD-10-CM | POA: Diagnosis not present

## 2022-06-28 ENCOUNTER — Ambulatory Visit (INDEPENDENT_AMBULATORY_CARE_PROVIDER_SITE_OTHER): Payer: Medicare Other

## 2022-06-28 DIAGNOSIS — I4729 Other ventricular tachycardia: Secondary | ICD-10-CM | POA: Diagnosis not present

## 2022-06-28 LAB — CUP PACEART REMOTE DEVICE CHECK
Battery Remaining Longevity: 17 mo
Battery Voltage: 2.9 V
Brady Statistic AP VP Percent: 0.02 %
Brady Statistic AP VS Percent: 22.33 %
Brady Statistic AS VP Percent: 0.06 %
Brady Statistic AS VS Percent: 77.59 %
Brady Statistic RA Percent Paced: 21.95 %
Brady Statistic RV Percent Paced: 0.08 %
Date Time Interrogation Session: 20240412114621
HighPow Impedance: 68 Ohm
Implantable Lead Connection Status: 753985
Implantable Lead Connection Status: 753985
Implantable Lead Implant Date: 20151202
Implantable Lead Implant Date: 20151202
Implantable Lead Location: 753859
Implantable Lead Location: 753860
Implantable Lead Model: 5076
Implantable Lead Model: 6935
Implantable Pulse Generator Implant Date: 20151202
Lead Channel Impedance Value: 323 Ohm
Lead Channel Impedance Value: 380 Ohm
Lead Channel Impedance Value: 399 Ohm
Lead Channel Pacing Threshold Amplitude: 0.625 V
Lead Channel Pacing Threshold Amplitude: 0.75 V
Lead Channel Pacing Threshold Pulse Width: 0.4 ms
Lead Channel Pacing Threshold Pulse Width: 0.4 ms
Lead Channel Sensing Intrinsic Amplitude: 1.875 mV
Lead Channel Sensing Intrinsic Amplitude: 1.875 mV
Lead Channel Sensing Intrinsic Amplitude: 7.625 mV
Lead Channel Sensing Intrinsic Amplitude: 7.625 mV
Lead Channel Setting Pacing Amplitude: 1.5 V
Lead Channel Setting Pacing Amplitude: 2 V
Lead Channel Setting Pacing Pulse Width: 0.4 ms
Lead Channel Setting Sensing Sensitivity: 0.3 mV
Zone Setting Status: 755011

## 2022-07-08 ENCOUNTER — Telehealth: Payer: Self-pay | Admitting: Internal Medicine

## 2022-07-08 NOTE — Telephone Encounter (Signed)
Pt spouse would like a callback regarding her noticing the ICD on the pt shifting and she's concerned. Please advise

## 2022-07-08 NOTE — Telephone Encounter (Signed)
Wife returned call.   States patient has lost weight and she is concerned patients device has shifted. Explained that the device is supposed to move from side to side when touching and also if patient loses weight the device may appear like its in a different place or show more. Also most recent remote was normal. Wife was appreciative of information.

## 2022-07-08 NOTE — Telephone Encounter (Signed)
Attempted to return call. No answer, LMTCB. 

## 2022-08-01 NOTE — Progress Notes (Signed)
Remote ICD transmission.   

## 2022-08-28 DIAGNOSIS — I10 Essential (primary) hypertension: Secondary | ICD-10-CM | POA: Diagnosis not present

## 2022-08-28 DIAGNOSIS — K2100 Gastro-esophageal reflux disease with esophagitis, without bleeding: Secondary | ICD-10-CM | POA: Diagnosis not present

## 2022-08-28 DIAGNOSIS — E7849 Other hyperlipidemia: Secondary | ICD-10-CM | POA: Diagnosis not present

## 2022-08-28 DIAGNOSIS — E039 Hypothyroidism, unspecified: Secondary | ICD-10-CM | POA: Diagnosis not present

## 2022-08-28 DIAGNOSIS — E1142 Type 2 diabetes mellitus with diabetic polyneuropathy: Secondary | ICD-10-CM | POA: Diagnosis not present

## 2022-09-06 DIAGNOSIS — E1342 Other specified diabetes mellitus with diabetic polyneuropathy: Secondary | ICD-10-CM | POA: Diagnosis not present

## 2022-09-06 DIAGNOSIS — Z1212 Encounter for screening for malignant neoplasm of rectum: Secondary | ICD-10-CM | POA: Diagnosis not present

## 2022-09-06 DIAGNOSIS — I482 Chronic atrial fibrillation, unspecified: Secondary | ICD-10-CM | POA: Diagnosis not present

## 2022-09-06 DIAGNOSIS — M1711 Unilateral primary osteoarthritis, right knee: Secondary | ICD-10-CM | POA: Diagnosis not present

## 2022-09-06 DIAGNOSIS — D692 Other nonthrombocytopenic purpura: Secondary | ICD-10-CM | POA: Diagnosis not present

## 2022-09-06 DIAGNOSIS — D6869 Other thrombophilia: Secondary | ICD-10-CM | POA: Diagnosis not present

## 2022-09-06 DIAGNOSIS — E782 Mixed hyperlipidemia: Secondary | ICD-10-CM | POA: Diagnosis not present

## 2022-09-06 DIAGNOSIS — I10 Essential (primary) hypertension: Secondary | ICD-10-CM | POA: Diagnosis not present

## 2022-09-06 DIAGNOSIS — Z6828 Body mass index (BMI) 28.0-28.9, adult: Secondary | ICD-10-CM | POA: Diagnosis not present

## 2022-09-06 DIAGNOSIS — M545 Low back pain, unspecified: Secondary | ICD-10-CM | POA: Diagnosis not present

## 2022-09-06 DIAGNOSIS — K2100 Gastro-esophageal reflux disease with esophagitis, without bleeding: Secondary | ICD-10-CM | POA: Diagnosis not present

## 2022-09-06 DIAGNOSIS — E1142 Type 2 diabetes mellitus with diabetic polyneuropathy: Secondary | ICD-10-CM | POA: Diagnosis not present

## 2022-09-06 DIAGNOSIS — M5030 Other cervical disc degeneration, unspecified cervical region: Secondary | ICD-10-CM | POA: Diagnosis not present

## 2022-09-06 DIAGNOSIS — I25119 Atherosclerotic heart disease of native coronary artery with unspecified angina pectoris: Secondary | ICD-10-CM | POA: Diagnosis not present

## 2022-09-06 DIAGNOSIS — Z9189 Other specified personal risk factors, not elsewhere classified: Secondary | ICD-10-CM | POA: Diagnosis not present

## 2022-09-06 DIAGNOSIS — E7849 Other hyperlipidemia: Secondary | ICD-10-CM | POA: Diagnosis not present

## 2022-09-17 DIAGNOSIS — L570 Actinic keratosis: Secondary | ICD-10-CM | POA: Diagnosis not present

## 2022-09-17 DIAGNOSIS — L719 Rosacea, unspecified: Secondary | ICD-10-CM | POA: Diagnosis not present

## 2022-09-17 DIAGNOSIS — Z85828 Personal history of other malignant neoplasm of skin: Secondary | ICD-10-CM | POA: Diagnosis not present

## 2022-09-17 DIAGNOSIS — L218 Other seborrheic dermatitis: Secondary | ICD-10-CM | POA: Diagnosis not present

## 2022-09-27 ENCOUNTER — Ambulatory Visit (INDEPENDENT_AMBULATORY_CARE_PROVIDER_SITE_OTHER): Payer: Medicare Other

## 2022-09-27 DIAGNOSIS — I4729 Other ventricular tachycardia: Secondary | ICD-10-CM

## 2022-09-30 LAB — CUP PACEART REMOTE DEVICE CHECK
Battery Remaining Longevity: 15 mo
Battery Voltage: 2.89 V
Brady Statistic AP VP Percent: 0.04 %
Brady Statistic AP VS Percent: 31.82 %
Brady Statistic AS VP Percent: 0.05 %
Brady Statistic AS VS Percent: 68.09 %
Brady Statistic RA Percent Paced: 31.13 %
Brady Statistic RV Percent Paced: 0.09 %
Date Time Interrogation Session: 20240712150559
HighPow Impedance: 67 Ohm
Implantable Lead Connection Status: 753985
Implantable Lead Connection Status: 753985
Implantable Lead Implant Date: 20151202
Implantable Lead Implant Date: 20151202
Implantable Lead Location: 753859
Implantable Lead Location: 753860
Implantable Lead Model: 5076
Implantable Lead Model: 6935
Implantable Pulse Generator Implant Date: 20151202
Lead Channel Impedance Value: 323 Ohm
Lead Channel Impedance Value: 342 Ohm
Lead Channel Impedance Value: 399 Ohm
Lead Channel Pacing Threshold Amplitude: 0.5 V
Lead Channel Pacing Threshold Amplitude: 0.75 V
Lead Channel Pacing Threshold Pulse Width: 0.4 ms
Lead Channel Pacing Threshold Pulse Width: 0.4 ms
Lead Channel Sensing Intrinsic Amplitude: 1.375 mV
Lead Channel Sensing Intrinsic Amplitude: 1.375 mV
Lead Channel Sensing Intrinsic Amplitude: 7 mV
Lead Channel Sensing Intrinsic Amplitude: 7 mV
Lead Channel Setting Pacing Amplitude: 1.5 V
Lead Channel Setting Pacing Amplitude: 2 V
Lead Channel Setting Pacing Pulse Width: 0.4 ms
Lead Channel Setting Sensing Sensitivity: 0.3 mV
Zone Setting Status: 755011

## 2022-10-14 NOTE — Progress Notes (Signed)
Remote ICD transmission.   

## 2022-11-08 ENCOUNTER — Encounter (HOSPITAL_COMMUNITY): Payer: Self-pay | Admitting: *Deleted

## 2022-11-08 ENCOUNTER — Emergency Department (HOSPITAL_COMMUNITY)
Admission: EM | Admit: 2022-11-08 | Discharge: 2022-11-08 | Disposition: A | Discharge status: Home / Self Care | Payer: Medicare Other | Attending: Emergency Medicine | Admitting: Emergency Medicine

## 2022-11-08 ENCOUNTER — Other Ambulatory Visit: Payer: Self-pay

## 2022-11-08 DIAGNOSIS — E119 Type 2 diabetes mellitus without complications: Secondary | ICD-10-CM | POA: Insufficient documentation

## 2022-11-08 DIAGNOSIS — L509 Urticaria, unspecified: Secondary | ICD-10-CM

## 2022-11-08 DIAGNOSIS — R21 Rash and other nonspecific skin eruption: Secondary | ICD-10-CM | POA: Diagnosis present

## 2022-11-08 DIAGNOSIS — Z7984 Long term (current) use of oral hypoglycemic drugs: Secondary | ICD-10-CM | POA: Insufficient documentation

## 2022-11-08 DIAGNOSIS — Z794 Long term (current) use of insulin: Secondary | ICD-10-CM | POA: Insufficient documentation

## 2022-11-08 DIAGNOSIS — Z7982 Long term (current) use of aspirin: Secondary | ICD-10-CM | POA: Insufficient documentation

## 2022-11-08 MED ORDER — PREDNISONE 20 MG PO TABS
40.0000 mg | ORAL_TABLET | Freq: Once | ORAL | Status: AC
  Administered 2022-11-08: 40 mg via ORAL
  Filled 2022-11-08: qty 2

## 2022-11-08 MED ORDER — PREDNISONE 20 MG PO TABS
40.0000 mg | ORAL_TABLET | Freq: Every day | ORAL | 0 refills | Status: AC
Start: 2022-11-08 — End: 2022-11-12

## 2022-11-08 MED ORDER — FAMOTIDINE 20 MG PO TABS
20.0000 mg | ORAL_TABLET | Freq: Once | ORAL | Status: AC
  Administered 2022-11-08: 20 mg via ORAL
  Filled 2022-11-08: qty 1

## 2022-11-08 NOTE — Discharge Instructions (Signed)
Please take Zyrtec daily.  Take the steroids as well and follow-up with your primary care doctor and dermatologist, they may need to do some allergy testing if you have any recurrent issues.  Come back to the ER for new or worsening symptoms.

## 2022-11-08 NOTE — ED Provider Notes (Signed)
[place] EMERGENCY DEPARTMENT AT [facility] Provider Note   CSN: [id number] Arrival date & time: 11/08/22  1828     History  Chief Complaint  Patient presents with   Rash    Trevor [name] is a 82 y.o. male.  Presents the ER for rash to bilateral arms chest back abdomen that started today after he got a bath.  Any soaps lotions or detergents, no new medications.  No fevers or chills, no trouble swallowing or breathing, no chest pain or shortness of breath.  No swelling of his lips tongue or face.  Rash initially raised but improved with Benadryl at home.  Took 2 tablets of Benadryl.  Itching is improved.   Rash      Home Medications Prior to Admission medications   Medication Sig Start Date End Date Taking? Authorizing Provider  predniSONE (DELTASONE) 20 MG tablet Take 2 tablets (40 mg total) by mouth daily for 4 days. 11/08/22 11/12/22 Yes Jarnell Cordaro A, PA-C  acetaminophen (TYLENOL) 650 MG CR tablet Take 650-1,300 mg by mouth every 8 (eight) hours as needed for pain.    [provider]  allopurinol (ZYLOPRIM) 300 MG tablet Take 300 mg by mouth daily. 12/22/18   [provider]  aspirin EC 81 MG tablet Take 81 mg by mouth daily. Swallow whole.    [provider]  atorvastatin (LIPITOR) 40 MG tablet Take 40 mg by mouth at bedtime.    [provider]  COLCRYS 0.6 MG tablet Take 0.6 mg by mouth 2 (two) times daily as needed (gout flare ups).  02/11/18   [provider]  diphenhydrAMINE (BENADRYL) 25 MG tablet Take 25 mg by mouth every 6 (six) hours as needed (itching/allergies.). Patient not taking: Reported on 01/17/2022    [provider]  ELIQUIS 5 MG TABS tablet TAKE 1 TABLET BY MOUTH TWICE A DAY 06/18/22   Marinus Maw, MD  HYDROCORTISONE EX Apply 1 application topically 2 (two) times daily as needed (skin irritation/rash). Patient not taking: Reported on 01/17/2022    [provider]  insulin  NPH-regular Human (NOVOLIN 70/30) (70-30) 100 UNIT/ML injection Inject 50 Units into the skin at bedtime.    [provider]  ketotifen (ZADITOR) 0.025 % ophthalmic solution Place 1 drop into both eyes 2 (two) times daily as needed (allergy eyes (during Spring & Fall ONLY)).    [provider]  lisinopril (PRINIVIL,ZESTRIL) 40 MG tablet Take 40 mg by mouth every evening.     [provider]  lisinopril (ZESTRIL) 5 MG tablet Take 5 mg by mouth daily.    [provider]  metFORMIN (GLUCOPHAGE) 1000 MG tablet Take 1,000 mg by mouth 2 (two) times daily with a meal.    [provider]  metoprolol tartrate (LOPRESSOR) 25 MG tablet TAKE 1 TABLET 2 (TWO) TIMES DAILY. PLEASE KEEP OCTOBER APPOINTMENT FOR FUTURE REFILLS. THANK YOU. 03/19/22   Marinus Maw, MD  traMADol (ULTRAM) 50 MG tablet Take 50 mg by mouth 3 (three) times daily as needed (back pain.).  03/04/19   [provider]      Allergies    Zetia [ezetimibe]    Review of Systems   Review of Systems  Skin:  Positive for rash.    Physical Exam Updated Vital Signs BP (!) 140/73 (BP Location: Right Arm)   Pulse (!) 110   Temp 98.4 F (36.9 C) (Oral)   Resp 18   Ht 6' (1.829 m)  Wt 90.7 kg   SpO2 98%   BMI 27.12 kg/m  Physical Exam Vitals and nursing note reviewed.  Constitutional:      General: He is not in acute distress.    Appearance: He is well-developed.  HENT:     Head: Normocephalic and atraumatic.     Mouth/Throat:     Mouth: Mucous membranes are moist. No angioedema.     Pharynx: No oropharyngeal exudate or posterior oropharyngeal erythema.     Comments: No Posterior pharyngeal swelling Eyes:     Extraocular Movements: Extraocular movements intact.     Conjunctiva/sclera: Conjunctivae normal.     Pupils: Pupils are equal, round, and reactive to light.  Cardiovascular:     Rate and Rhythm: Normal rate and regular rhythm.     Heart sounds: No murmur  heard. Pulmonary:     Effort: Pulmonary effort is normal. No respiratory distress.     Breath sounds: Normal breath sounds.  Abdominal:     Palpations: Abdomen is soft.     Tenderness: There is no abdominal tenderness.  Musculoskeletal:        General: No swelling. Normal range of motion.     Cervical back: Neck supple.  Skin:    General: Skin is warm and dry.     Capillary Refill: Capillary refill takes less than 2 seconds.     Comments: Parents of resolving urticaria to posterior aspect of bilateral upper arms, the lower back, the lower abdomen and the chest.  Neurological:     General: No focal deficit present.     Mental Status: He is alert and oriented to person, place, and time.     Motor: No weakness.     Gait: Gait normal.  Psychiatric:        Mood and Affect: Mood normal.     ED Results / Procedures / Treatments   Labs (all labs ordered are listed, but only abnormal results are displayed) Labs Reviewed  CBG MONITORING, ED    EKG None  Radiology No results found.  Procedures Procedures    Medications Ordered in ED Medications  famotidine (PEPCID) tablet 20 mg (20 mg Oral Given 11/08/22 2044)  predniSONE (DELTASONE) tablet 40 mg (40 mg Oral Given 11/08/22 2044)    ED Course/ Medical Decision Making/ A&P                                 Medical Decision Making DDx: Contact dermatitis, allergic urticaria, atopic dermatitis, psoriasis, anaphylaxis, other ED course: Patient presents the ER for evaluation of a rash, took Benadryl at home and it is improving, he has no vomiting or diarrhea, no chest pain or shortness of breath.  Discussed with patient and his wife that we would treat with antihistamines, steroids.  Discussed Benadryl will be less sedating at home.  Discussed risk of hyperglycemia and need for close monitoring of blood sugar with the steroids since he is diabetic.  They did not want to stay for an Accu-Chek, did not stay for recheck of heart rate as  it was slightly elevated, patient states he wants to go home and eat.  Discussed follow-up and return precautions.    Risk Prescription drug management.           Final Clinical Impression(s) / ED Diagnoses Final diagnoses:  Urticaria    Rx / DC Orders ED Discharge Orders  Ordered    predniSONE (DELTASONE) 20 MG tablet  Daily        11/08/22 2042              Josem Kaufmann 11/08/22 2250    Benjiman Core, MD 11/09/22 [phone]

## 2022-11-08 NOTE — ED Triage Notes (Signed)
Pt began to itch after visiting someone at workshop, pt took a shower and noted hives all over.  Pt denies any SOB or trouble swallowing.  Denies any new soaps/lotions.  Pt took two benadryl PTA.  C/o itching

## 2022-11-30 ENCOUNTER — Other Ambulatory Visit: Payer: Self-pay | Admitting: Internal Medicine

## 2022-12-10 DIAGNOSIS — M961 Postlaminectomy syndrome, not elsewhere classified: Secondary | ICD-10-CM | POA: Diagnosis not present

## 2022-12-27 ENCOUNTER — Ambulatory Visit (INDEPENDENT_AMBULATORY_CARE_PROVIDER_SITE_OTHER): Payer: Medicare Other

## 2022-12-27 DIAGNOSIS — I4729 Other ventricular tachycardia: Secondary | ICD-10-CM | POA: Diagnosis not present

## 2022-12-29 ENCOUNTER — Other Ambulatory Visit: Payer: Self-pay | Admitting: Internal Medicine

## 2022-12-29 DIAGNOSIS — I480 Paroxysmal atrial fibrillation: Secondary | ICD-10-CM

## 2022-12-30 NOTE — Telephone Encounter (Signed)
Prescription refill request for Eliquis received. Indication:afib Last office visit:11/23 Scr:1.0  6/24 Age: 81 Weight:90.7  kg  Prescription refilled

## 2022-12-31 LAB — CUP PACEART REMOTE DEVICE CHECK
Battery Remaining Longevity: 14 mo
Battery Voltage: 2.88 V
Brady Statistic AP VP Percent: 0.02 %
Brady Statistic AP VS Percent: 21.77 %
Brady Statistic AS VP Percent: 0.06 %
Brady Statistic AS VS Percent: 78.15 %
Brady Statistic RA Percent Paced: 21.65 %
Brady Statistic RV Percent Paced: 0.08 %
Date Time Interrogation Session: 20241011123304
HighPow Impedance: 68 Ohm
Implantable Lead Connection Status: 753985
Implantable Lead Connection Status: 753985
Implantable Lead Implant Date: 20151202
Implantable Lead Implant Date: 20151202
Implantable Lead Location: 753859
Implantable Lead Location: 753860
Implantable Lead Model: 5076
Implantable Lead Model: 6935
Implantable Pulse Generator Implant Date: 20151202
Lead Channel Impedance Value: 380 Ohm
Lead Channel Impedance Value: 380 Ohm
Lead Channel Impedance Value: 437 Ohm
Lead Channel Pacing Threshold Amplitude: 0.5 V
Lead Channel Pacing Threshold Amplitude: 0.875 V
Lead Channel Pacing Threshold Pulse Width: 0.4 ms
Lead Channel Pacing Threshold Pulse Width: 0.4 ms
Lead Channel Sensing Intrinsic Amplitude: 2.25 mV
Lead Channel Sensing Intrinsic Amplitude: 2.25 mV
Lead Channel Sensing Intrinsic Amplitude: 7.875 mV
Lead Channel Sensing Intrinsic Amplitude: 7.875 mV
Lead Channel Setting Pacing Amplitude: 1.5 V
Lead Channel Setting Pacing Amplitude: 2 V
Lead Channel Setting Pacing Pulse Width: 0.4 ms
Lead Channel Setting Sensing Sensitivity: 0.3 mV
Zone Setting Status: 755011

## 2023-01-01 DIAGNOSIS — I10 Essential (primary) hypertension: Secondary | ICD-10-CM | POA: Diagnosis not present

## 2023-01-01 DIAGNOSIS — Z1321 Encounter for screening for nutritional disorder: Secondary | ICD-10-CM | POA: Diagnosis not present

## 2023-01-01 DIAGNOSIS — E7849 Other hyperlipidemia: Secondary | ICD-10-CM | POA: Diagnosis not present

## 2023-01-01 DIAGNOSIS — Z1329 Encounter for screening for other suspected endocrine disorder: Secondary | ICD-10-CM | POA: Diagnosis not present

## 2023-01-01 DIAGNOSIS — E1142 Type 2 diabetes mellitus with diabetic polyneuropathy: Secondary | ICD-10-CM | POA: Diagnosis not present

## 2023-01-06 NOTE — Progress Notes (Signed)
Remote ICD transmission.   

## 2023-01-08 DIAGNOSIS — I25119 Atherosclerotic heart disease of native coronary artery with unspecified angina pectoris: Secondary | ICD-10-CM | POA: Diagnosis not present

## 2023-01-08 DIAGNOSIS — I482 Chronic atrial fibrillation, unspecified: Secondary | ICD-10-CM | POA: Diagnosis not present

## 2023-01-08 DIAGNOSIS — E1342 Other specified diabetes mellitus with diabetic polyneuropathy: Secondary | ICD-10-CM | POA: Diagnosis not present

## 2023-01-08 DIAGNOSIS — M5030 Other cervical disc degeneration, unspecified cervical region: Secondary | ICD-10-CM | POA: Diagnosis not present

## 2023-01-08 DIAGNOSIS — E1142 Type 2 diabetes mellitus with diabetic polyneuropathy: Secondary | ICD-10-CM | POA: Diagnosis not present

## 2023-01-08 DIAGNOSIS — D692 Other nonthrombocytopenic purpura: Secondary | ICD-10-CM | POA: Diagnosis not present

## 2023-01-08 DIAGNOSIS — D6869 Other thrombophilia: Secondary | ICD-10-CM | POA: Diagnosis not present

## 2023-01-08 DIAGNOSIS — M545 Low back pain, unspecified: Secondary | ICD-10-CM | POA: Diagnosis not present

## 2023-01-08 DIAGNOSIS — Z23 Encounter for immunization: Secondary | ICD-10-CM | POA: Diagnosis not present

## 2023-01-08 DIAGNOSIS — I10 Essential (primary) hypertension: Secondary | ICD-10-CM | POA: Diagnosis not present

## 2023-01-08 DIAGNOSIS — Z0001 Encounter for general adult medical examination with abnormal findings: Secondary | ICD-10-CM | POA: Diagnosis not present

## 2023-01-08 DIAGNOSIS — E7849 Other hyperlipidemia: Secondary | ICD-10-CM | POA: Diagnosis not present

## 2023-01-28 ENCOUNTER — Ambulatory Visit: Payer: Medicare Other | Attending: Internal Medicine | Admitting: Internal Medicine

## 2023-01-28 ENCOUNTER — Encounter: Payer: Self-pay | Admitting: Internal Medicine

## 2023-01-28 VITALS — BP 140/58 | HR 70 | Ht 72.0 in | Wt 199.2 lb

## 2023-01-28 DIAGNOSIS — I480 Paroxysmal atrial fibrillation: Secondary | ICD-10-CM | POA: Diagnosis not present

## 2023-01-28 DIAGNOSIS — I4729 Other ventricular tachycardia: Secondary | ICD-10-CM | POA: Diagnosis not present

## 2023-01-28 LAB — CUP PACEART INCLINIC DEVICE CHECK
Date Time Interrogation Session: 20241112104750
Implantable Lead Connection Status: 753985
Implantable Lead Connection Status: 753985
Implantable Lead Implant Date: 20151202
Implantable Lead Implant Date: 20151202
Implantable Lead Location: 753859
Implantable Lead Location: 753860
Implantable Lead Model: 5076
Implantable Lead Model: 6935
Implantable Pulse Generator Implant Date: 20151202

## 2023-01-28 NOTE — Progress Notes (Signed)
HPI Mr. Langenfeld returns today for followup of his ICD. He is a pleasant 82 yo man with a h/o VF arrest, CAD, s/p MI, atrial fib and gout. He is s/p medtronic ICD insertion. In the interim, he notes that he has done well. He does admit to some dietary indiscretion with sodium. No chest pain or sob or edema or syncope. He has had some difficulty staying asleep. He has not had any ICD therapies.  He had a stroke remotely despite being on eliquis and despite having only minutes of atrial fib a day. He would like to return to driving.  Allergies  Allergen Reactions   Zetia [Ezetimibe] Other (See Comments)    CAUSES MUSCLE ACHES     Current Outpatient Medications  Medication Sig Dispense Refill   acetaminophen (TYLENOL) 650 MG CR tablet Take 650-1,300 mg by mouth every 8 (eight) hours as needed for pain.     allopurinol (ZYLOPRIM) 300 MG tablet Take 300 mg by mouth daily.     aspirin EC 81 MG tablet Take 81 mg by mouth daily. Swallow whole.     atorvastatin (LIPITOR) 40 MG tablet Take 40 mg by mouth at bedtime.     COLCRYS 0.6 MG tablet Take 0.6 mg by mouth 2 (two) times daily as needed (gout flare ups).   3   ELIQUIS 5 MG TABS tablet TAKE 1 TABLET BY MOUTH TWICE A DAY 180 tablet 1   lisinopril (ZESTRIL) 5 MG tablet Take 5 mg by mouth daily.     metFORMIN (GLUCOPHAGE) 1000 MG tablet Take 1,000 mg by mouth 2 (two) times daily with a meal.     metoprolol tartrate (LOPRESSOR) 25 MG tablet TAKE 1 TABLET 2 (TWO) TIMES DAILY. PLEASE KEEP OCTOBER APPOINTMENT FOR FUTURE REFILLS. THANK YOU. 180 tablet 0   diphenhydrAMINE (BENADRYL) 25 MG tablet Take 25 mg by mouth every 6 (six) hours as needed (itching/allergies.). (Patient not taking: Reported on 01/17/2022)     HYDROCORTISONE EX Apply 1 application topically 2 (two) times daily as needed (skin irritation/rash). (Patient not taking: Reported on 01/17/2022)     traMADol (ULTRAM) 50 MG tablet Take 50 mg by mouth 3 (three) times daily as needed (back  pain.).  (Patient not taking: Reported on 01/28/2023)     No current facility-administered medications for this visit.     Past Medical History:  Diagnosis Date   CAD (coronary artery disease)    a. RCA PCI in 2003 b. 2008-RCA was occluded; residual CAD- 70% CFX and 50% LAD with normal LVF c. low risk Myoview in Dec 2011   Diabetes mellitus (HCC)    Dyslipidemia    GERD (gastroesophageal reflux disease)    HTN (hypertension)    Ischemic cardiomyopathy    a. s/p MDT dual chamber ICD 02-2014   Osteoarthritis     ROS:   All systems reviewed and negative except as noted in the HPI.   Past Surgical History:  Procedure Laterality Date   CARDIAC CATHETERIZATION  2008   occluded RCA- med Rx   CATARACT EXTRACTION Right July 2011   CATARACT EXTRACTION W/PHACO Left 03/25/2019   Procedure: CATARACT EXTRACTION PHACO AND INTRAOCULAR LENS PLACEMENT (IOC);  Surgeon: Fabio Pierce, MD;  Location: [facility];  Service: Ophthalmology;  Laterality: Left;  CDE: 11.80    CERVICAL SPINE SURGERY  Feb 2010   CORONARY ANGIOPLASTY  2003   RCA PCI   IMPLANTABLE CARDIOVERTER DEFIBRILLATOR IMPLANT  02-16-2014   MDT Melvyn Neth  dual chamber ICD implanted by Dr Ladona Ridgel   IMPLANTABLE CARDIOVERTER DEFIBRILLATOR IMPLANT N/A 02/16/2014   Procedure: IMPLANTABLE CARDIOVERTER DEFIBRILLATOR IMPLANT;  Surgeon: Marinus Maw, MD;  Location[facility] C[facility] LAB;  Service: Cardiovascular;  Laterality: N/A;   LEFT HEART CATHETERIZATION WITH CORONARY ANGIOGRAM N/A 01/05/2014   Procedure: LEFT HEART CATHETERIZATION WITH CORONARY ANGIOGRAM;  Surgeon: Iran Ouch, MD;  Location: [facility] CATH LAB;  Service: Cardiovascular;  Laterality: N/A;     Family History  Problem Relation Age of Onset   Diabetes Mother    Cancer - Prostate Father    Diabetes Other      Social History   Socioeconomic History   Marital status: Married    Spouse name: Not on file   Number of children: Not on file   Years of education: Not on file   Highest  education level: Not on file  Occupational History   Not on file  Tobacco Use   Smoking status: Former    Current packs/day: 0.00    Types: Cigarettes    Quit date: 03/25/1983    Years since quitting: 39.8   Smokeless tobacco: Former    Quit date: 01/04/1983  Vaping Use   Vaping status: Never Used  Substance and Sexual Activity   Alcohol use: Yes    Comment: ONCE IN A WHILE    Drug use: No   Sexual activity: Yes  Other Topics Concern   Not on file  Social History Narrative   Not on file   Social Determinants of Health   Financial Resource Strain: Low Risk  (08/21/2021)   Received from [facility]   Overall Financial Resource Strain (CARDIA)    Difficulty of Paying Living Expenses: Not hard at all  Food Insecurity: No Food Insecurity (10/03/2021)   Received from [facility], [facility]   Hunger Vital Sign    Worried About Running Out of Food in the Last Year: Never true    Ran Out of Food in the Last Year: Never true  Transportation Needs: No Transportation Needs (08/16/2021)   Received from [facility]   PRAPARE - Transportation    Lack of Transportation (Medical): No    Lack of Transportation (Non-Medical): No  Physical Activity: Not on file  Stress: No Stress Concern Present (08/14/2021)   Received from [facility] - Occupational Stress Questionnaire    Feeling of Stress : Not at all  Social Connections: Unknown (08/14/2021)   Received from [facility]   Social Network    Social Network: Not on file  Intimate Partner Violence: Unknown (08/14/2021)   [facility] from [facility], [facility]   HITS    Physically Hurt: Not on file    Insult or Talk Down To: Not on file    Threaten Physical Harm: Not on file    Scream or Curse: Not on file     BP (!) 140/58   Pulse 70   Ht 6' (1.829 m)   Wt 199 lb 3.2 oz (90.4 kg)   SpO2 96%   BMI 27.02 kg/m    Physical Exam:  Well appearing 82 yo man, NAD HEENT: Unremarkable Neck:  No JVD, no thyromegally Lymphatics:  No adenopathy Back:  No CVA tenderness Lungs:  Clear with no wheezes HEART:  Regular rate rhythm, no murmurs, no rubs, no clicks Abd:  soft, positive bowel sounds, no organomegally, no rebound, no guarding Ext:  2 plus pulses, no edema, no cyanosis, no clubbing Skin:  No rashes no nodules Neuro:  CN II through XII intact, motor grossly intact  EKG - nsr with pvc's  DEVICE  Normal device function.  See PaceArt for details.   Assess/Plan: 1. VF - he has had no recurrent ventricular arrhythmias. 2. PAF - he has had a few seconds of atrial fib. He will continue his systemic anti-coagulation. 3. ICD - his medtronic DDD ICD is working normally and has several years of battery longevity left 4. CAD - he is s/p MI. No anginal symptoms.    Sharlot Gowda Salina Stanfield,MD

## 2023-01-28 NOTE — Patient Instructions (Signed)

## 2023-03-09 ENCOUNTER — Other Ambulatory Visit: Payer: Self-pay | Admitting: Internal Medicine

## 2023-03-28 ENCOUNTER — Ambulatory Visit (INDEPENDENT_AMBULATORY_CARE_PROVIDER_SITE_OTHER): Payer: Medicare Other

## 2023-03-28 DIAGNOSIS — I4729 Other ventricular tachycardia: Secondary | ICD-10-CM

## 2023-04-04 LAB — CUP PACEART REMOTE DEVICE CHECK
Battery Remaining Longevity: 11 mo
Battery Voltage: 2.87 V
Brady Statistic AP VP Percent: 0.04 %
Brady Statistic AP VS Percent: 22.46 %
Brady Statistic AS VP Percent: 0.07 %
Brady Statistic AS VS Percent: 77.44 %
Brady Statistic RA Percent Paced: 21.6 %
Brady Statistic RV Percent Paced: 0.1 %
Date Time Interrogation Session: 20250116100337
HighPow Impedance: 67 Ohm
Implantable Lead Connection Status: 753985
Implantable Lead Connection Status: 753985
Implantable Lead Implant Date: 20151202
Implantable Lead Implant Date: 20151202
Implantable Lead Location: 753859
Implantable Lead Location: 753860
Implantable Lead Model: 5076
Implantable Lead Model: 6935
Implantable Pulse Generator Implant Date: 20151202
Lead Channel Impedance Value: 304 Ohm
Lead Channel Impedance Value: 380 Ohm
Lead Channel Impedance Value: 399 Ohm
Lead Channel Pacing Threshold Amplitude: 0.5 V
Lead Channel Pacing Threshold Amplitude: 0.75 V
Lead Channel Pacing Threshold Pulse Width: 0.4 ms
Lead Channel Pacing Threshold Pulse Width: 0.4 ms
Lead Channel Sensing Intrinsic Amplitude: 2 mV
Lead Channel Sensing Intrinsic Amplitude: 2 mV
Lead Channel Sensing Intrinsic Amplitude: 7.25 mV
Lead Channel Sensing Intrinsic Amplitude: 7.25 mV
Lead Channel Setting Pacing Amplitude: 1.5 V
Lead Channel Setting Pacing Amplitude: 2 V
Lead Channel Setting Pacing Pulse Width: 0.4 ms
Lead Channel Setting Sensing Sensitivity: 0.3 mV
Zone Setting Status: 755011

## 2023-04-07 ENCOUNTER — Encounter: Payer: Self-pay | Admitting: Internal Medicine

## 2023-04-30 DIAGNOSIS — D0421 Carcinoma in situ of skin of right ear and external auricular canal: Secondary | ICD-10-CM | POA: Diagnosis not present

## 2023-04-30 DIAGNOSIS — L719 Rosacea, unspecified: Secondary | ICD-10-CM | POA: Diagnosis not present

## 2023-04-30 DIAGNOSIS — D485 Neoplasm of uncertain behavior of skin: Secondary | ICD-10-CM | POA: Diagnosis not present

## 2023-04-30 DIAGNOSIS — Z85828 Personal history of other malignant neoplasm of skin: Secondary | ICD-10-CM | POA: Diagnosis not present

## 2023-05-06 NOTE — Addendum Note (Signed)
 Addended by: Elease Etienne A on: 05/06/2023 12:16 PM   Modules accepted: Orders

## 2023-05-06 NOTE — Progress Notes (Signed)
 Remote ICD transmission.

## 2023-05-14 DIAGNOSIS — E1142 Type 2 diabetes mellitus with diabetic polyneuropathy: Secondary | ICD-10-CM | POA: Diagnosis not present

## 2023-05-14 DIAGNOSIS — E7849 Other hyperlipidemia: Secondary | ICD-10-CM | POA: Diagnosis not present

## 2023-05-14 DIAGNOSIS — I482 Chronic atrial fibrillation, unspecified: Secondary | ICD-10-CM | POA: Diagnosis not present

## 2023-05-14 DIAGNOSIS — I10 Essential (primary) hypertension: Secondary | ICD-10-CM | POA: Diagnosis not present

## 2023-05-14 DIAGNOSIS — Z6828 Body mass index (BMI) 28.0-28.9, adult: Secondary | ICD-10-CM | POA: Diagnosis not present

## 2023-05-14 DIAGNOSIS — E782 Mixed hyperlipidemia: Secondary | ICD-10-CM | POA: Diagnosis not present

## 2023-05-15 DIAGNOSIS — C44222 Squamous cell carcinoma of skin of right ear and external auricular canal: Secondary | ICD-10-CM | POA: Diagnosis not present

## 2023-06-26 ENCOUNTER — Telehealth: Payer: Self-pay | Admitting: Internal Medicine

## 2023-06-26 ENCOUNTER — Other Ambulatory Visit: Payer: Self-pay | Admitting: Internal Medicine

## 2023-06-26 DIAGNOSIS — I480 Paroxysmal atrial fibrillation: Secondary | ICD-10-CM

## 2023-06-26 MED ORDER — APIXABAN 5 MG PO TABS: 5.0000 mg | ORAL_TABLET | Freq: Two times a day (BID) | ORAL | 1 refills | Status: AC

## 2023-06-26 NOTE — Telephone Encounter (Signed)
 Prescription refill request for Eliquis received. Indication: afib  Last office visit: Ladona Ridgel 01/28/2023 Scr: 1.0,  09/06/2022 Age: 83 yo  Weight: 90.4 kg  Refill sent.

## 2023-06-26 NOTE — Telephone Encounter (Signed)
 Prescription refill request for Eliquis received. Indication: Afib  Last office visit: 01/28/23 Trevor [name])  Scr: 1.0 (09/06/22 via [facility])  Age: 83 Weight: 90.4kg  Appropriate dose. Refill sent.

## 2023-06-26 NOTE — Telephone Encounter (Signed)
*  STAT* If patient is at the pharmacy, call can be transferred to refill team.   1. Which medications need to be refilled? (please list name of each medication and dose if known)   ELIQUIS 5 MG TABS tablet  TAKE 1 TABLET BY MOUTH TWICE A DAY    2. Would you like to learn more about the convenience, safety, & potential cost savings by using the [facility]? No   3. Are you open to using the [facility] No   4. Which pharmacy/location (including street and city if local pharmacy) is medication to be sent to?[id number] - [place],  - [address]    5. Do they need a 30 day or 90 day supply? 90 Day Supply

## 2023-06-27 ENCOUNTER — Ambulatory Visit (INDEPENDENT_AMBULATORY_CARE_PROVIDER_SITE_OTHER): Payer: Medicare Other

## 2023-06-27 DIAGNOSIS — I4901 Ventricular fibrillation: Secondary | ICD-10-CM | POA: Diagnosis not present

## 2023-06-29 LAB — CUP PACEART REMOTE DEVICE CHECK
Battery Remaining Longevity: 9 mo
Battery Voltage: 2.86 V
Brady Statistic AP VP Percent: 0.02 %
Brady Statistic AP VS Percent: 18.24 %
Brady Statistic AS VP Percent: 0.06 %
Brady Statistic AS VS Percent: 81.68 %
Brady Statistic RA Percent Paced: 17.81 %
Brady Statistic RV Percent Paced: 0.08 %
Date Time Interrogation Session: 20250411114823
HighPow Impedance: 69 Ohm
Implantable Lead Connection Status: 753985
Implantable Lead Connection Status: 753985
Implantable Lead Implant Date: 20151202
Implantable Lead Implant Date: 20151202
Implantable Lead Location: 753859
Implantable Lead Location: 753860
Implantable Lead Model: 5076
Implantable Lead Model: 6935
Implantable Pulse Generator Implant Date: 20151202
Lead Channel Impedance Value: 323 Ohm
Lead Channel Impedance Value: 342 Ohm
Lead Channel Impedance Value: 399 Ohm
Lead Channel Pacing Threshold Amplitude: 0.625 V
Lead Channel Pacing Threshold Amplitude: 0.875 V
Lead Channel Pacing Threshold Pulse Width: 0.4 ms
Lead Channel Pacing Threshold Pulse Width: 0.4 ms
Lead Channel Sensing Intrinsic Amplitude: 1.875 mV
Lead Channel Sensing Intrinsic Amplitude: 1.875 mV
Lead Channel Sensing Intrinsic Amplitude: 7.5 mV
Lead Channel Sensing Intrinsic Amplitude: 7.5 mV
Lead Channel Setting Pacing Amplitude: 1.5 V
Lead Channel Setting Pacing Amplitude: 2 V
Lead Channel Setting Pacing Pulse Width: 0.4 ms
Lead Channel Setting Sensing Sensitivity: 0.3 mV
Zone Setting Status: 755011

## 2023-07-01 ENCOUNTER — Encounter: Payer: Self-pay | Admitting: Internal Medicine

## 2023-07-17 DIAGNOSIS — M79671 Pain in right foot: Secondary | ICD-10-CM | POA: Diagnosis not present

## 2023-07-17 DIAGNOSIS — M79674 Pain in right toe(s): Secondary | ICD-10-CM | POA: Diagnosis not present

## 2023-07-17 DIAGNOSIS — L89892 Pressure ulcer of other site, stage 2: Secondary | ICD-10-CM | POA: Diagnosis not present

## 2023-07-17 DIAGNOSIS — E1140 Type 2 diabetes mellitus with diabetic neuropathy, unspecified: Secondary | ICD-10-CM | POA: Diagnosis not present

## 2023-07-31 DIAGNOSIS — M79671 Pain in right foot: Secondary | ICD-10-CM | POA: Diagnosis not present

## 2023-07-31 DIAGNOSIS — M79674 Pain in right toe(s): Secondary | ICD-10-CM | POA: Diagnosis not present

## 2023-07-31 DIAGNOSIS — L89892 Pressure ulcer of other site, stage 2: Secondary | ICD-10-CM | POA: Diagnosis not present

## 2023-07-31 DIAGNOSIS — E1140 Type 2 diabetes mellitus with diabetic neuropathy, unspecified: Secondary | ICD-10-CM | POA: Diagnosis not present

## 2023-08-04 NOTE — Progress Notes (Signed)
 Remote ICD transmission.

## 2023-08-04 NOTE — Addendum Note (Signed)
 Addended by: Lott Rouleau A on: 08/04/2023 09:34 AM   Modules accepted: Orders

## 2023-08-20 DIAGNOSIS — M79671 Pain in right foot: Secondary | ICD-10-CM | POA: Diagnosis not present

## 2023-08-20 DIAGNOSIS — E1140 Type 2 diabetes mellitus with diabetic neuropathy, unspecified: Secondary | ICD-10-CM | POA: Diagnosis not present

## 2023-08-20 DIAGNOSIS — L89892 Pressure ulcer of other site, stage 2: Secondary | ICD-10-CM | POA: Diagnosis not present

## 2023-08-20 DIAGNOSIS — M79674 Pain in right toe(s): Secondary | ICD-10-CM | POA: Diagnosis not present

## 2023-08-30 DIAGNOSIS — I10 Essential (primary) hypertension: Secondary | ICD-10-CM | POA: Diagnosis not present

## 2023-08-30 DIAGNOSIS — R58 Hemorrhage, not elsewhere classified: Secondary | ICD-10-CM | POA: Diagnosis not present

## 2023-08-30 DIAGNOSIS — W19XXXA Unspecified fall, initial encounter: Secondary | ICD-10-CM | POA: Diagnosis not present

## 2023-09-26 ENCOUNTER — Ambulatory Visit: Payer: Medicare Other

## 2023-09-26 DIAGNOSIS — I4901 Ventricular fibrillation: Secondary | ICD-10-CM

## 2023-09-29 ENCOUNTER — Telehealth: Payer: Self-pay | Admitting: Internal Medicine

## 2023-09-29 NOTE — Telephone Encounter (Signed)
  1. Has your device fired? No   2. Is you device beeping? no  3. Are you experiencing draining or swelling at device site? no  4. Are you calling to see if we received your device transmission? yes  5. Have you passed out? no    Please route to Device Clinic Pool  

## 2023-10-02 LAB — CUP PACEART REMOTE DEVICE CHECK
Battery Remaining Longevity: 6 mo
Battery Voltage: 2.85 V
Brady Statistic AP VP Percent: 0.08 %
Brady Statistic AP VS Percent: 38.17 %
Brady Statistic AS VP Percent: 0.12 %
Brady Statistic AS VS Percent: 61.62 %
Brady Statistic RA Percent Paced: 34.12 %
Brady Statistic RV Percent Paced: 0.18 %
Date Time Interrogation Session: 20250716083245
HighPow Impedance: 65 Ohm
Implantable Lead Connection Status: 753985
Implantable Lead Connection Status: 753985
Implantable Lead Implant Date: 20151202
Implantable Lead Implant Date: 20151202
Implantable Lead Location: 753859
Implantable Lead Location: 753860
Implantable Lead Model: 5076
Implantable Lead Model: 6935
Implantable Pulse Generator Implant Date: 20151202
Lead Channel Impedance Value: 304 Ohm
Lead Channel Impedance Value: 342 Ohm
Lead Channel Impedance Value: 380 Ohm
Lead Channel Pacing Threshold Amplitude: 0.625 V
Lead Channel Pacing Threshold Amplitude: 0.75 V
Lead Channel Pacing Threshold Pulse Width: 0.4 ms
Lead Channel Pacing Threshold Pulse Width: 0.4 ms
Lead Channel Sensing Intrinsic Amplitude: 1.75 mV
Lead Channel Sensing Intrinsic Amplitude: 1.75 mV
Lead Channel Sensing Intrinsic Amplitude: 7.375 mV
Lead Channel Sensing Intrinsic Amplitude: 7.375 mV
Lead Channel Setting Pacing Amplitude: 1.5 V
Lead Channel Setting Pacing Amplitude: 2 V
Lead Channel Setting Pacing Pulse Width: 0.4 ms
Lead Channel Setting Sensing Sensitivity: 0.3 mV
Zone Setting Status: 755011

## 2023-10-06 ENCOUNTER — Ambulatory Visit: Payer: Self-pay | Admitting: Internal Medicine

## 2023-11-03 ENCOUNTER — Encounter

## 2023-11-12 DIAGNOSIS — E7849 Other hyperlipidemia: Secondary | ICD-10-CM | POA: Diagnosis not present

## 2023-11-12 DIAGNOSIS — E1142 Type 2 diabetes mellitus with diabetic polyneuropathy: Secondary | ICD-10-CM | POA: Diagnosis not present

## 2023-11-12 DIAGNOSIS — Z1329 Encounter for screening for other suspected endocrine disorder: Secondary | ICD-10-CM | POA: Diagnosis not present

## 2023-11-18 DIAGNOSIS — L218 Other seborrheic dermatitis: Secondary | ICD-10-CM | POA: Diagnosis not present

## 2023-11-18 DIAGNOSIS — Z85828 Personal history of other malignant neoplasm of skin: Secondary | ICD-10-CM | POA: Diagnosis not present

## 2023-11-18 DIAGNOSIS — L719 Rosacea, unspecified: Secondary | ICD-10-CM | POA: Diagnosis not present

## 2023-11-18 DIAGNOSIS — L570 Actinic keratosis: Secondary | ICD-10-CM | POA: Diagnosis not present

## 2023-11-19 DIAGNOSIS — Z6829 Body mass index (BMI) 29.0-29.9, adult: Secondary | ICD-10-CM | POA: Diagnosis not present

## 2023-11-19 DIAGNOSIS — E1142 Type 2 diabetes mellitus with diabetic polyneuropathy: Secondary | ICD-10-CM | POA: Diagnosis not present

## 2023-11-19 DIAGNOSIS — I482 Chronic atrial fibrillation, unspecified: Secondary | ICD-10-CM | POA: Diagnosis not present

## 2023-11-19 DIAGNOSIS — I10 Essential (primary) hypertension: Secondary | ICD-10-CM | POA: Diagnosis not present

## 2023-11-19 DIAGNOSIS — E782 Mixed hyperlipidemia: Secondary | ICD-10-CM | POA: Diagnosis not present

## 2023-12-04 ENCOUNTER — Ambulatory Visit

## 2023-12-08 LAB — CUP PACEART REMOTE DEVICE CHECK
Battery Remaining Longevity: 6 mo
Battery Voltage: 2.85 V
Brady Statistic AP VP Percent: 0.09 %
Brady Statistic AP VS Percent: 47.89 %
Brady Statistic AS VP Percent: 0.07 %
Brady Statistic AS VS Percent: 51.95 %
Brady Statistic RA Percent Paced: 40.87 %
Brady Statistic RV Percent Paced: 0.13 %
Date Time Interrogation Session: 20250919114442
HighPow Impedance: 66 Ohm
Implantable Lead Connection Status: 753985
Implantable Lead Connection Status: 753985
Implantable Lead Implant Date: 20151202
Implantable Lead Implant Date: 20151202
Implantable Lead Location: 753859
Implantable Lead Location: 753860
Implantable Lead Model: 5076
Implantable Lead Model: 6935
Implantable Pulse Generator Implant Date: 20151202
Lead Channel Impedance Value: 342 Ohm
Lead Channel Impedance Value: 342 Ohm
Lead Channel Impedance Value: 380 Ohm
Lead Channel Pacing Threshold Amplitude: 0.625 V
Lead Channel Pacing Threshold Amplitude: 0.75 V
Lead Channel Pacing Threshold Pulse Width: 0.4 ms
Lead Channel Pacing Threshold Pulse Width: 0.4 ms
Lead Channel Sensing Intrinsic Amplitude: 2 mV
Lead Channel Sensing Intrinsic Amplitude: 2 mV
Lead Channel Sensing Intrinsic Amplitude: 8.375 mV
Lead Channel Sensing Intrinsic Amplitude: 8.375 mV
Lead Channel Setting Pacing Amplitude: 1.5 V
Lead Channel Setting Pacing Amplitude: 2 V
Lead Channel Setting Pacing Pulse Width: 0.4 ms
Lead Channel Setting Sensing Sensitivity: 0.3 mV
Zone Setting Status: 755011

## 2023-12-09 DIAGNOSIS — Z23 Encounter for immunization: Secondary | ICD-10-CM | POA: Diagnosis not present

## 2023-12-13 ENCOUNTER — Ambulatory Visit: Payer: Self-pay | Admitting: Internal Medicine

## 2023-12-17 DIAGNOSIS — Z6829 Body mass index (BMI) 29.0-29.9, adult: Secondary | ICD-10-CM | POA: Diagnosis not present

## 2023-12-17 DIAGNOSIS — Z0001 Encounter for general adult medical examination with abnormal findings: Secondary | ICD-10-CM | POA: Diagnosis not present

## 2023-12-17 DIAGNOSIS — Z1331 Encounter for screening for depression: Secondary | ICD-10-CM | POA: Diagnosis not present

## 2023-12-17 DIAGNOSIS — Z1389 Encounter for screening for other disorder: Secondary | ICD-10-CM | POA: Diagnosis not present

## 2023-12-25 NOTE — Progress Notes (Signed)
 Remote ICD Transmission

## 2023-12-26 ENCOUNTER — Ambulatory Visit (INDEPENDENT_AMBULATORY_CARE_PROVIDER_SITE_OTHER)

## 2023-12-26 DIAGNOSIS — I4901 Ventricular fibrillation: Secondary | ICD-10-CM

## 2023-12-31 ENCOUNTER — Encounter

## 2023-12-31 LAB — CUP PACEART REMOTE DEVICE CHECK
Battery Remaining Longevity: 4 mo
Battery Voltage: 2.83 V
Brady Statistic AP VP Percent: 0.08 %
Brady Statistic AP VS Percent: 54.17 %
Brady Statistic AS VP Percent: 0.06 %
Brady Statistic AS VS Percent: 45.69 %
Brady Statistic RA Percent Paced: 44.47 %
Brady Statistic RV Percent Paced: 0.12 %
Date Time Interrogation Session: 20251015130642
HighPow Impedance: 65 Ohm
Implantable Lead Connection Status: 753985
Implantable Lead Connection Status: 753985
Implantable Lead Implant Date: 20151202
Implantable Lead Implant Date: 20151202
Implantable Lead Location: 753859
Implantable Lead Location: 753860
Implantable Lead Model: 5076
Implantable Lead Model: 6935
Implantable Pulse Generator Implant Date: 20151202
Lead Channel Impedance Value: 342 Ohm
Lead Channel Impedance Value: 380 Ohm
Lead Channel Impedance Value: 380 Ohm
Lead Channel Pacing Threshold Amplitude: 0.625 V
Lead Channel Pacing Threshold Amplitude: 0.75 V
Lead Channel Pacing Threshold Pulse Width: 0.4 ms
Lead Channel Pacing Threshold Pulse Width: 0.4 ms
Lead Channel Sensing Intrinsic Amplitude: 1.625 mV
Lead Channel Sensing Intrinsic Amplitude: 1.625 mV
Lead Channel Sensing Intrinsic Amplitude: 7.75 mV
Lead Channel Sensing Intrinsic Amplitude: 7.75 mV
Lead Channel Setting Pacing Amplitude: 1.5 V
Lead Channel Setting Pacing Amplitude: 2 V
Lead Channel Setting Pacing Pulse Width: 0.4 ms
Lead Channel Setting Sensing Sensitivity: 0.3 mV
Zone Setting Status: 755011

## 2024-01-01 ENCOUNTER — Ambulatory Visit: Payer: Self-pay | Admitting: Internal Medicine

## 2024-01-01 NOTE — Progress Notes (Signed)
 Remote ICD Transmission

## 2024-01-05 ENCOUNTER — Encounter

## 2024-01-26 ENCOUNTER — Encounter

## 2024-01-26 ENCOUNTER — Ambulatory Visit: Attending: Family Medicine

## 2024-01-28 ENCOUNTER — Encounter: Payer: Self-pay | Admitting: Internal Medicine

## 2024-01-28 ENCOUNTER — Ambulatory Visit: Attending: Student in an Organized Health Care Education/Training Program | Admitting: Internal Medicine

## 2024-01-28 VITALS — BP 144/84 | HR 90 | Ht 72.0 in | Wt 202.0 lb

## 2024-01-28 DIAGNOSIS — Z95810 Presence of automatic (implantable) cardiac defibrillator: Secondary | ICD-10-CM | POA: Insufficient documentation

## 2024-01-28 DIAGNOSIS — I4901 Ventricular fibrillation: Secondary | ICD-10-CM | POA: Insufficient documentation

## 2024-01-28 DIAGNOSIS — I480 Paroxysmal atrial fibrillation: Secondary | ICD-10-CM | POA: Insufficient documentation

## 2024-01-28 DIAGNOSIS — I4729 Other ventricular tachycardia: Secondary | ICD-10-CM | POA: Diagnosis not present

## 2024-01-28 NOTE — Patient Instructions (Addendum)
 Medication Instructions:  Your physician recommends that you continue on your current medications as directed. Please refer to the Current Medication list given to you today.  *If you need a refill on your cardiac medications before your next appointment, please call your pharmacy*  Lab Work: None ordered.  You may go to any [facility] Location for your lab work:  [facility] - [id number] ([facility]) - [address] [phone] [address]  [place] - [address]   [place]  - [address]   [place] - [address] - [address] [employer]  - [address] [place] [address] ([facility]   If you have labs (blood work) drawn today and your tests are completely normal, you will receive your results only by: [employer] (if you have MyChart)  If you have any lab test that is abnormal or we need to change your treatment, we will call you or send a MyChart message to review the results.  Testing/Procedures: None ordered.  Follow-Up: At [facility], you and your health needs are our priority.  As part of our continuing mission to provide you with exceptional heart care, we have created designated Provider Care Teams.  These Care Teams include your primary Cardiologist (physician) and Advanced Practice Providers (APPs -  Physician Assistants and Nurse Practitioners) who all work together to provide you with the care you need, when you need it.  Your next appointment:   1 year(s)  The format for your next appointment:   In Person  Provider:   Donnice Primus, MD or one of the following Advanced Practice Providers on your designated Care Team:   Trevor [name], [place] Trevor [name], [place] Trevor Barrack, NP  Note: Remote monitoring is used to monitor your Pacemaker/ ICD from home. This monitoring reduces the number of office visits required to check  your device to one time per year. It allows us  to keep an eye on the functioning of your device to ensure it is working properly.

## 2024-01-28 NOTE — Progress Notes (Signed)
 HPI [name] returns today for followup of his ICD. He is a pleasant 83 yo man with a h/o VF arrest, CAD, s/p MI, atrial fib and gout. He is s/p medtronic ICD insertion. In the interim, he notes that he has done well. He does admit to some dietary indiscretion with sodium. No chest pain or sob or edema or syncope. He has had some difficulty staying asleep. He has not had any ICD therapies.  He had a stroke remotely despite being on eliquis  and despite having only minutes of atrial fib a day.  Allergies  Allergen Reactions   Zetia [Ezetimibe] Other (See Comments)    CAUSES MUSCLE ACHES     Current Outpatient Medications  Medication Sig Dispense Refill   acetaminophen  (TYLENOL ) 650 MG CR tablet Take 650-1,300 mg by mouth every 8 (eight) hours as needed for pain.     allopurinol (ZYLOPRIM) 300 MG tablet Take 300 mg by mouth daily.     apixaban  (ELIQUIS ) 5 MG TABS tablet Take 1 tablet (5 mg total) by mouth 2 (two) times daily. 180 tablet 1   aspirin EC 81 MG tablet Take 81 mg by mouth daily. Swallow whole.     atorvastatin  (LIPITOR) 40 MG tablet Take 40 mg by mouth at bedtime.     COLCRYS 0.6 MG tablet Take 0.6 mg by mouth 2 (two) times daily as needed (gout flare ups).   3   diphenhydrAMINE (BENADRYL) 25 MG tablet Take 25 mg by mouth every 6 (six) hours as needed (itching/allergies.).     HYDROCORTISONE EX Apply 1 application topically 2 (two) times daily as needed (skin irritation/rash).     lisinopril  (ZESTRIL ) 5 MG tablet Take 5 mg by mouth daily.     metFORMIN (GLUCOPHAGE) 1000 MG tablet Take 1,000 mg by mouth 2 (two) times daily with a meal.     metoprolol  tartrate (LOPRESSOR ) 25 MG tablet Take 1 tablet (25 mg total) by mouth 2 (two) times daily. 180 tablet 3   traMADol (ULTRAM) 50 MG tablet Take 50 mg by mouth 3 (three) times daily as needed (back pain.).      No current facility-administered medications for this visit.     Past Medical History:  Diagnosis Date   CAD  (coronary artery disease)    a. RCA PCI in 2003 b. 2008-RCA was occluded; residual CAD- 70% CFX and 50% LAD with normal LVF c. low risk Myoview in Dec 2011   Diabetes mellitus (HCC)    Dyslipidemia    GERD (gastroesophageal reflux disease)    HTN (hypertension)    Ischemic cardiomyopathy    a. s/p MDT dual chamber ICD 02-2014   Osteoarthritis     ROS:   All systems reviewed and negative except as noted in the HPI.   Past Surgical History:  Procedure Laterality Date   CARDIAC CATHETERIZATION  2008   occluded RCA- med Rx   CATARACT EXTRACTION Right July 2011   CATARACT EXTRACTION W/PHACO Left 03/25/2019   Procedure: CATARACT EXTRACTION PHACO AND INTRAOCULAR LENS PLACEMENT (IOC);  Surgeon: Harrie Agent, MD;  Location: [facility];  Service: Ophthalmology;  Laterality: Left;  CDE: 11.80    CERVICAL SPINE SURGERY  Feb 2010   CORONARY ANGIOPLASTY  2003   RCA PCI   IMPLANTABLE CARDIOVERTER DEFIBRILLATOR IMPLANT  02-16-2014   MDT Evera dual chamber ICD implanted by Dr Waddell   IMPLANTABLE CARDIOVERTER DEFIBRILLATOR IMPLANT N/A 02/16/2014   Procedure: IMPLANTABLE CARDIOVERTER DEFIBRILLATOR IMPLANT;  Surgeon:  Danelle LELON Birmingham, MD;  Location: [facility] CATH [facility];  Service: Cardiovascular;  Laterality: N/A;   LEFT HEART CATHETERIZATION WITH CORONARY ANGIOGRAM N/A 01/05/2014   Procedure: LEFT HEART CATHETERIZATION WITH CORONARY ANGIOGRAM;  Surgeon: Deatrice DELENA Cage, MD;  Location: [facility] CATH LAB;  Service: Cardiovascular;  Laterality: N/A;     Family History  Problem Relation Age of Onset   Diabetes Mother    Cancer - Prostate Father    Diabetes Other      Social History   Socioeconomic History   Marital status: Married    Spouse name: Not on file   Number of children: Not on file   Years of education: Not on file   Highest education level: Not on file  Occupational History   Not on file  Tobacco Use   Smoking status: Former    Current packs/day: 0.00    Types: Cigarettes    Quit date:  03/25/1983    Years since quitting: 40.8   Smokeless tobacco: Former    Quit date: 01/04/1983  Vaping Use   Vaping status: Never Used  Substance and Sexual Activity   Alcohol  use: Yes    Comment: ONCE IN A WHILE    Drug use: No   Sexual activity: Yes  Other Topics Concern   Not on file  Social History Narrative   Not on file   Social Drivers of Health   Financial Resource Strain: Low Risk  (08/21/2021)   Received from [facility]   Overall Financial Resource Strain (CARDIA)    Difficulty of Paying Living Expenses: Not hard at all  Food Insecurity: No Food Insecurity (10/03/2021)   Received from [facility]   Hunger Vital Sign    Within the past 12 months, you worried that your food would run out before you got the money to buy more.: Never true    Within the past 12 months, the food you bought just didn't last and you didn't have money to get more.: Never true  Transportation Needs: No Transportation Needs (08/16/2021)   Received from [facility] - Transportation    Lack of Transportation (Medical): No    Lack of Transportation (Non-Medical): No  Physical Activity: Not on file  Stress: No Stress Concern Present (08/14/2021)   Received from [facility] - Occupational Stress Questionnaire    Feeling of Stress : Not at all  Social Connections: Unknown (08/14/2021)   Received from [facility]   Social Network    Social Network: Not on file  Intimate Partner Violence: Unknown (08/14/2021)   Received from [facility]   HITS    Physically Hurt: Not on file    Insult or Talk Down To: Not on file    Threaten Physical Harm: Not on file    Scream or Curse: Not on file     BP (!) 144/84 (BP Location: Left Arm, Patient Position: Sitting, Cuff Size: Large)   Pulse 90   Ht 6' (1.829 m)   Wt 202 lb (91.6 kg)   SpO2 97%   BMI 27.40 kg/m   Physical Exam:  Well appearing NAD HEENT: Unremarkable Neck:  No JVD, no  thyromegally Lymphatics:  No adenopathy Back:  No CVA tenderness Lungs:  Clear HEART:  Regular rate rhythm, no murmurs, no rubs, no clicks Abd:  soft, positive bowel sounds, no organomegally, no rebound, no guarding Ext:  2 plus pulses, no edema, no cyanosis, no clubbing Skin:  No rashes no nodules Neuro:  CN II through XII intact, motor grossly intact  EKG - nsr with PVC's  DEVICE  Normal device function.  See PaceArt for details.   Assess/Plan: 1.VF - he has had no recurrent ventricular arrhythmias. 2. PAF - he has had a few seconds of atrial fib. He will continue his systemic anti-coagulation. 3. ICD - his medtronic DDD ICD is working normally and has 3 months of battery longevity left 4. CAD - he is s/p MI. No anginal symptoms.    Danelle Crimson Dubberly,MD

## 2024-02-02 ENCOUNTER — Other Ambulatory Visit: Payer: Self-pay | Admitting: Internal Medicine

## 2024-02-02 ENCOUNTER — Encounter

## 2024-02-05 ENCOUNTER — Encounter

## 2024-02-26 ENCOUNTER — Encounter

## 2024-02-26 ENCOUNTER — Ambulatory Visit: Attending: Internal Medicine

## 2024-02-27 LAB — CUP PACEART REMOTE DEVICE CHECK
Battery Remaining Longevity: 4 mo
Battery Voltage: 2.83 V
Brady Statistic AP VP Percent: 0.06 %
Brady Statistic AP VS Percent: 38.08 %
Brady Statistic AS VP Percent: 0.06 %
Brady Statistic AS VS Percent: 61.8 %
Brady Statistic RA Percent Paced: 35.77 %
Brady Statistic RV Percent Paced: 0.12 %
Date Time Interrogation Session: 20251211160937
HighPow Impedance: 72 Ohm
Implantable Lead Connection Status: 753985
Implantable Lead Connection Status: 753985
Implantable Lead Implant Date: 20151202
Implantable Lead Implant Date: 20151202
Implantable Lead Location: 753859
Implantable Lead Location: 753860
Implantable Lead Model: 5076
Implantable Lead Model: 6935
Implantable Pulse Generator Implant Date: 20151202
Lead Channel Impedance Value: 304 Ohm
Lead Channel Impedance Value: 380 Ohm
Lead Channel Impedance Value: 399 Ohm
Lead Channel Pacing Threshold Amplitude: 0.625 V
Lead Channel Pacing Threshold Amplitude: 0.75 V
Lead Channel Pacing Threshold Pulse Width: 0.4 ms
Lead Channel Pacing Threshold Pulse Width: 0.4 ms
Lead Channel Sensing Intrinsic Amplitude: 2 mV
Lead Channel Sensing Intrinsic Amplitude: 2 mV
Lead Channel Sensing Intrinsic Amplitude: 8.625 mV
Lead Channel Sensing Intrinsic Amplitude: 8.625 mV
Lead Channel Setting Pacing Amplitude: 1.5 V
Lead Channel Setting Pacing Amplitude: 2 V
Lead Channel Setting Pacing Pulse Width: 0.4 ms
Lead Channel Setting Sensing Sensitivity: 0.3 mV
Zone Setting Status: 755011

## 2024-02-29 ENCOUNTER — Ambulatory Visit: Payer: Self-pay | Admitting: Internal Medicine

## 2024-03-04 ENCOUNTER — Encounter

## 2024-03-08 ENCOUNTER — Encounter

## 2024-03-26 ENCOUNTER — Encounter

## 2024-03-28 ENCOUNTER — Ambulatory Visit

## 2024-03-28 DIAGNOSIS — I4901 Ventricular fibrillation: Secondary | ICD-10-CM | POA: Diagnosis not present

## 2024-03-30 LAB — CUP PACEART REMOTE DEVICE CHECK
Battery Remaining Longevity: 3 mo
Battery Voltage: 2.8 V
Brady Statistic AP VP Percent: 0.07 %
Brady Statistic AP VS Percent: 42.26 %
Brady Statistic AS VP Percent: 0.06 %
Brady Statistic AS VS Percent: 57.61 %
Brady Statistic RA Percent Paced: 38.57 %
Brady Statistic RV Percent Paced: 0.12 %
Date Time Interrogation Session: 20260112142711
HighPow Impedance: 68 Ohm
Implantable Lead Connection Status: 753985
Implantable Lead Connection Status: 753985
Implantable Lead Implant Date: 20151202
Implantable Lead Implant Date: 20151202
Implantable Lead Location: 753859
Implantable Lead Location: 753860
Implantable Lead Model: 5076
Implantable Lead Model: 6935
Implantable Pulse Generator Implant Date: 20151202
Lead Channel Impedance Value: 342 Ohm
Lead Channel Impedance Value: 399 Ohm
Lead Channel Impedance Value: 399 Ohm
Lead Channel Pacing Threshold Amplitude: 0.625 V
Lead Channel Pacing Threshold Amplitude: 0.875 V
Lead Channel Pacing Threshold Pulse Width: 0.4 ms
Lead Channel Pacing Threshold Pulse Width: 0.4 ms
Lead Channel Sensing Intrinsic Amplitude: 1.75 mV
Lead Channel Sensing Intrinsic Amplitude: 1.75 mV
Lead Channel Sensing Intrinsic Amplitude: 8.25 mV
Lead Channel Sensing Intrinsic Amplitude: 8.25 mV
Lead Channel Setting Pacing Amplitude: 1.5 V
Lead Channel Setting Pacing Amplitude: 2 V
Lead Channel Setting Pacing Pulse Width: 0.4 ms
Lead Channel Setting Sensing Sensitivity: 0.3 mV
Zone Setting Status: 755011

## 2024-03-31 ENCOUNTER — Encounter

## 2024-03-31 NOTE — Progress Notes (Signed)
 Remote ICD Transmission

## 2024-04-02 ENCOUNTER — Ambulatory Visit: Payer: Self-pay | Admitting: Cardiovascular Disease

## 2024-04-08 ENCOUNTER — Encounter

## 2024-04-28 ENCOUNTER — Ambulatory Visit

## 2024-05-10 ENCOUNTER — Encounter

## 2024-05-29 ENCOUNTER — Ambulatory Visit

## 2024-06-25 ENCOUNTER — Encounter

## 2024-06-29 ENCOUNTER — Ambulatory Visit

## 2024-06-30 ENCOUNTER — Encounter

## 2024-09-24 ENCOUNTER — Encounter

## 2024-09-29 ENCOUNTER — Encounter
